# Patient Record
Sex: Female | Born: 1985 | Hispanic: Yes | Marital: Single | Smoking: Never smoker
Health system: Southern US, Community
[De-identification: ages and names within clinical notes are randomized; demographics above are authoritative.]

## PROBLEM LIST (undated history)

## (undated) DIAGNOSIS — Z9889 Other specified postprocedural states: Secondary | ICD-10-CM

## (undated) DIAGNOSIS — N6312 Unspecified lump in the right breast, upper inner quadrant: Secondary | ICD-10-CM

## (undated) DIAGNOSIS — Z789 Other specified health status: Secondary | ICD-10-CM

## (undated) DIAGNOSIS — R519 Headache, unspecified: Secondary | ICD-10-CM

## (undated) DIAGNOSIS — C801 Malignant (primary) neoplasm, unspecified: Secondary | ICD-10-CM

## (undated) HISTORY — PX: NO PAST SURGERIES: SHX2092

## (undated) HISTORY — DX: Unspecified lump in the right breast, upper inner quadrant: N63.12

---

## 2009-02-28 ENCOUNTER — Ambulatory Visit (HOSPITAL_COMMUNITY): Admission: RE | Admit: 2009-02-28 | Discharge: 2009-02-28 | Payer: Self-pay | Admitting: Obstetrics

## 2009-07-02 ENCOUNTER — Inpatient Hospital Stay (HOSPITAL_COMMUNITY): Admission: AD | Admit: 2009-07-02 | Discharge: 2009-07-04 | Payer: Self-pay | Admitting: Obstetrics

## 2010-03-20 ENCOUNTER — Encounter: Admission: RE | Admit: 2010-03-20 | Discharge: 2010-03-20 | Payer: Self-pay | Admitting: Family Medicine

## 2010-05-19 ENCOUNTER — Encounter: Payer: Self-pay | Admitting: Obstetrics

## 2010-06-07 ENCOUNTER — Other Ambulatory Visit: Payer: Self-pay | Admitting: Family Medicine

## 2010-06-13 IMAGING — US US OB DETAIL+14 WK
1 series · 18 of 28 positions shown · non-contrast
Comparison: none

OBSTETRICAL ULTRASOUND:
 This ultrasound was performed in The [HOSPITAL], and the [facility] report will be stored to [REDACTED] PACS.  This report is also available in [HOSPITAL]?s accessANYware.

[Series 1: us ob detail+14 wk · 18 of 89 slices shown]
[im 1/89]
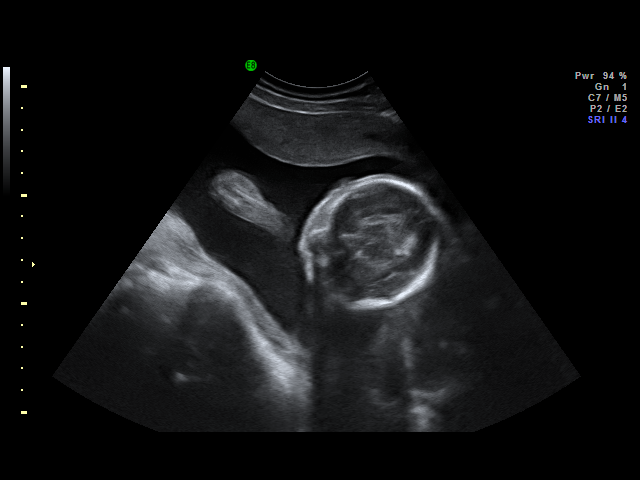
[im 7/89]
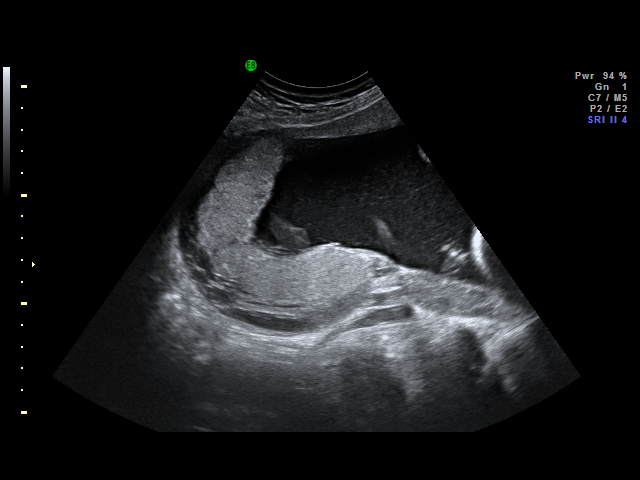
[im 10/89]
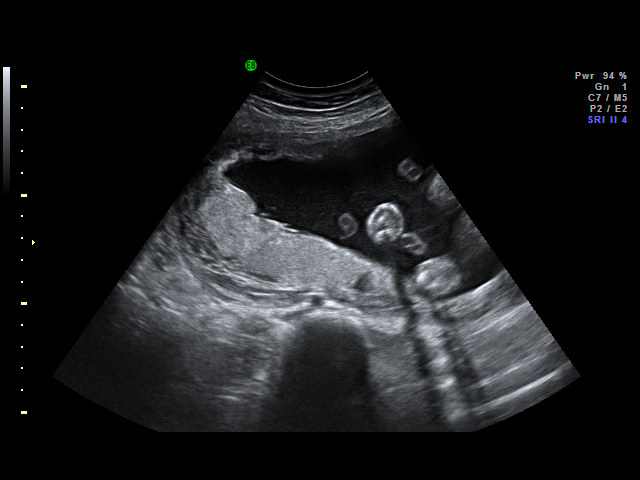
[im 17/89]
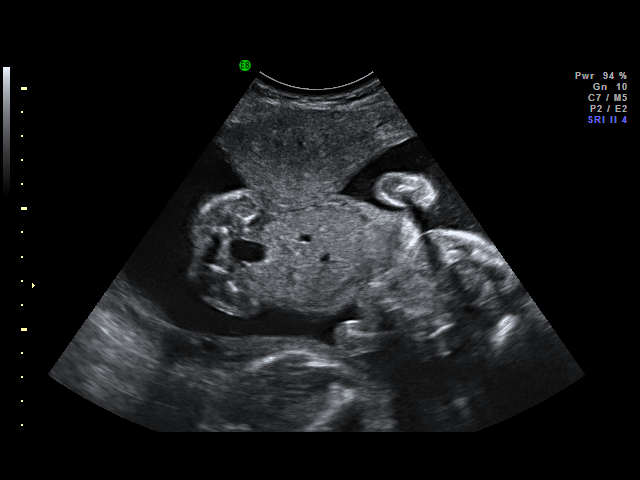
[im 23/89]
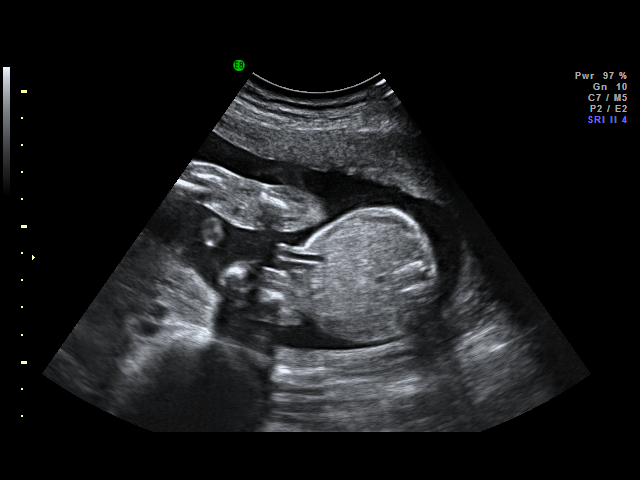
[im 27/89]
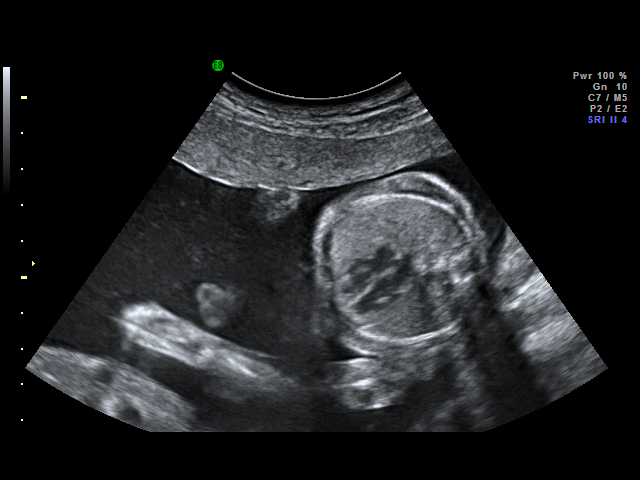
[im 33/89]
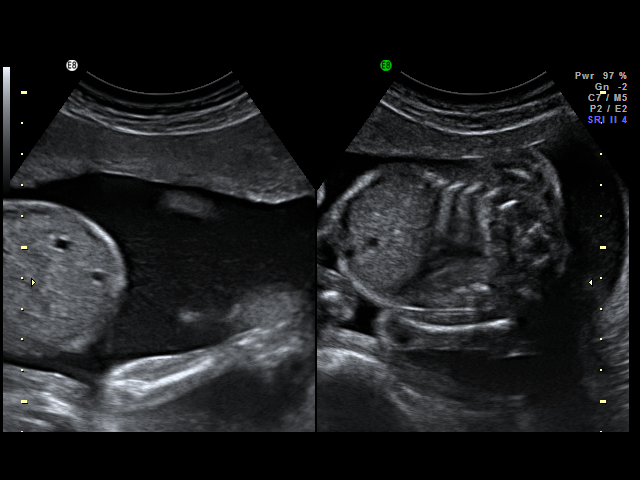
[im 36/89]
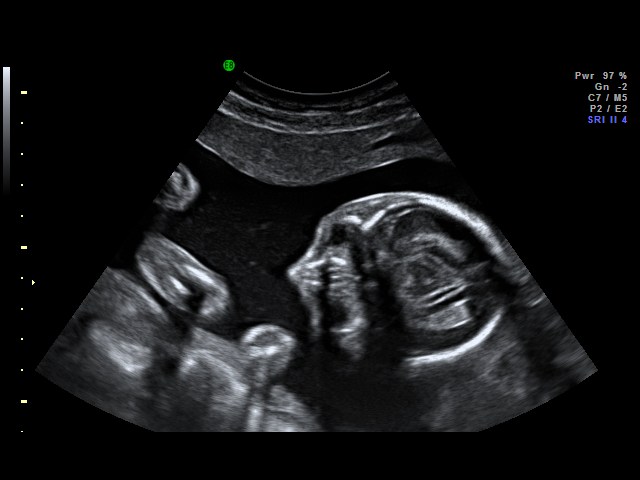
[im 43/89]
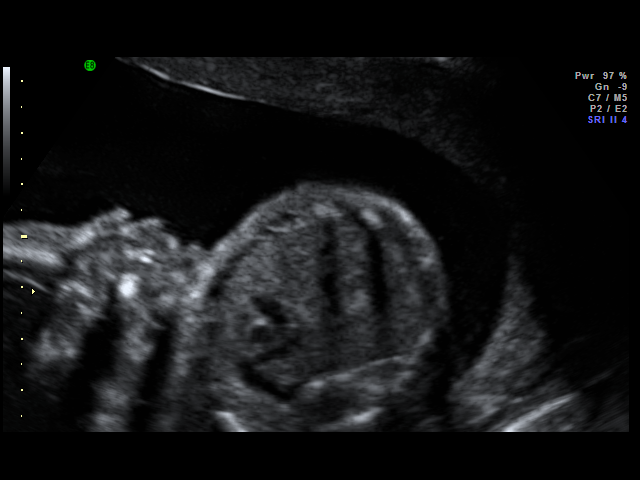
[im 46/89]
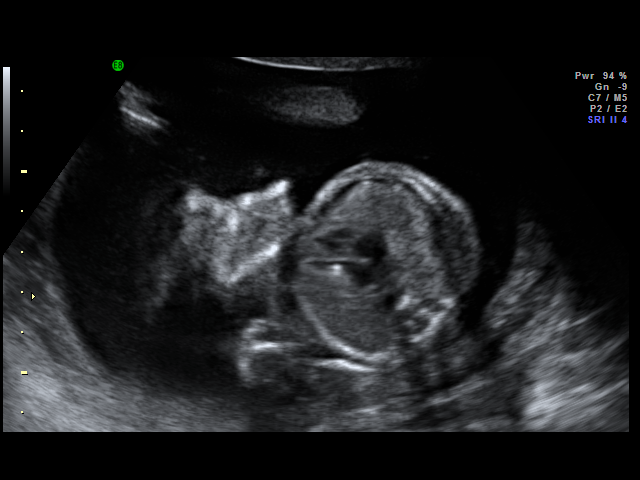
[im 53/89]
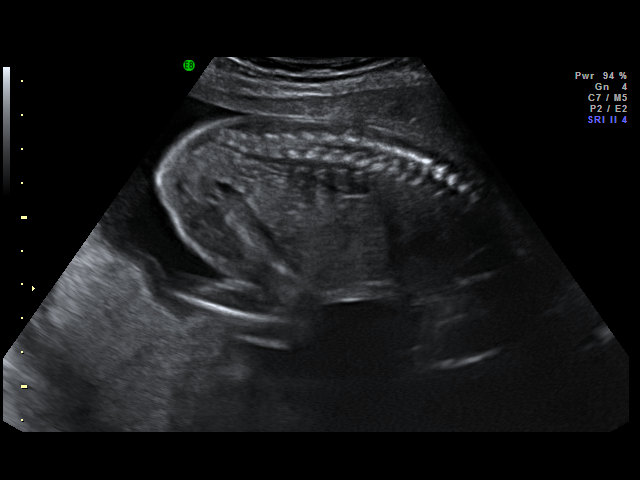
[im 56/89]
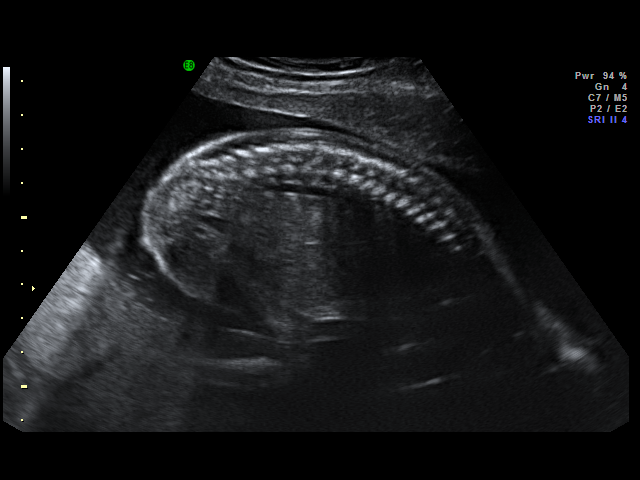
[im 62/89]
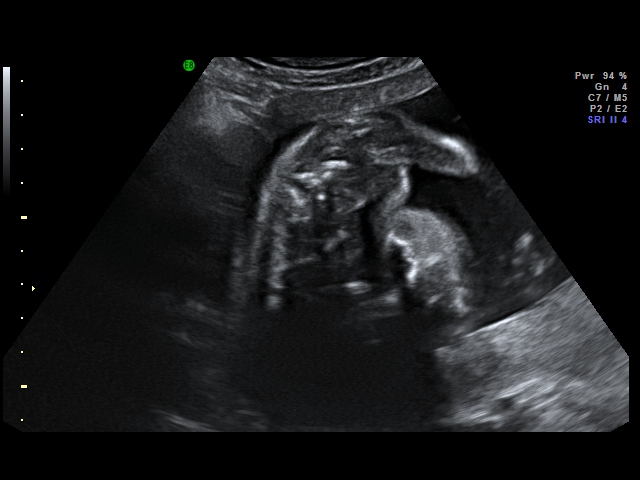
[im 69/89]
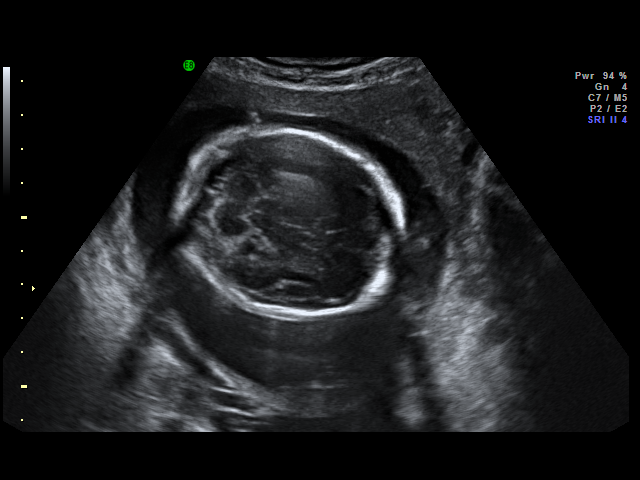
[im 72/89]
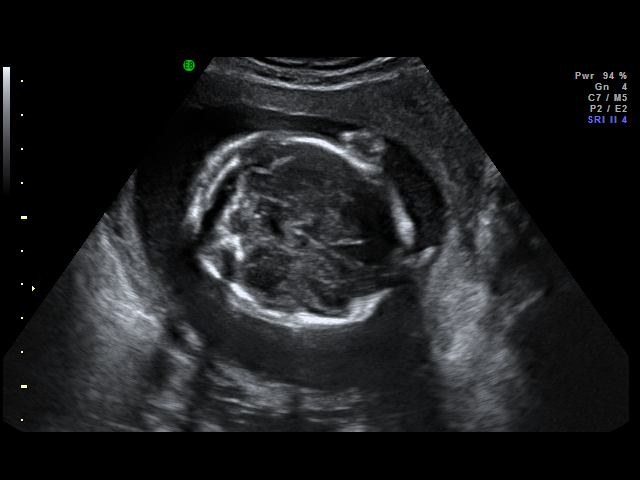
[im 79/89]
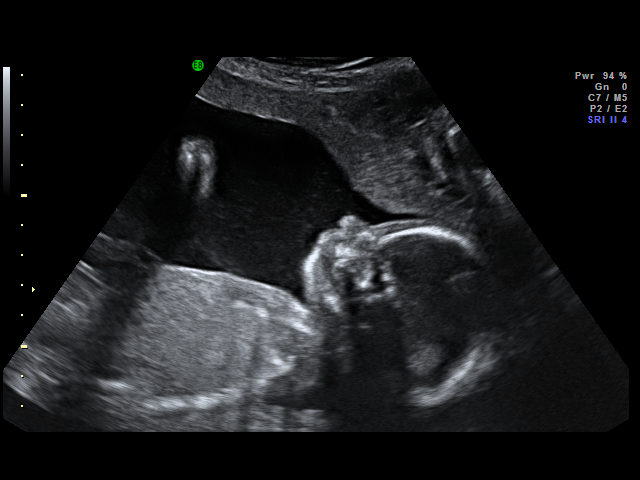
[im 82/89]
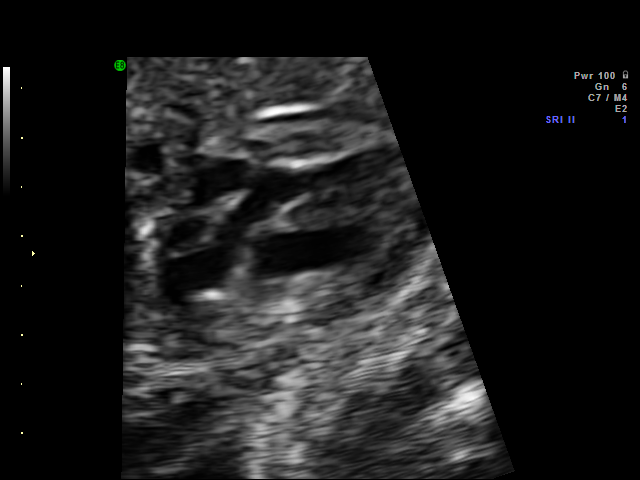
[im 89/89]
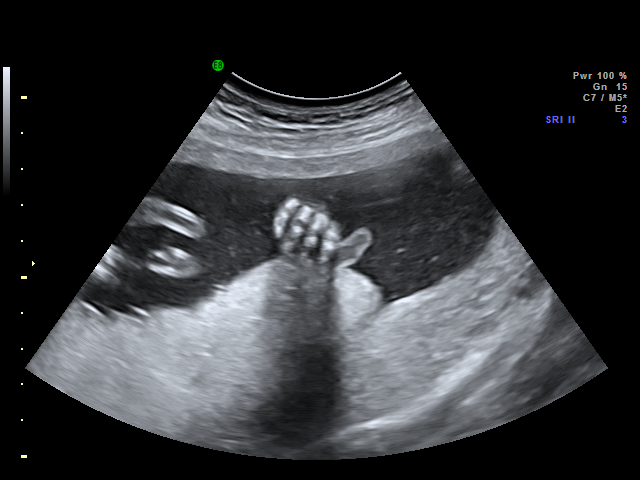

[18 of 28 positions shown; findings below may reference images not displayed]

IMPRESSION: [facility] has also been faxed to the ordering physician.

## 2010-07-22 LAB — CBC
Platelets: 172 10*3/uL (ref 150–400)
RBC: 3.24 MIL/uL — ABNORMAL LOW (ref 3.87–5.11)
RDW: 13.9 % (ref 11.5–15.5)
WBC: 16.8 10*3/uL — ABNORMAL HIGH (ref 4.0–10.5)

## 2010-07-22 LAB — RPR: RPR Ser Ql: NONREACTIVE

## 2011-07-09 ENCOUNTER — Encounter (HOSPITAL_COMMUNITY): Payer: Self-pay | Admitting: *Deleted

## 2011-07-09 ENCOUNTER — Emergency Department (HOSPITAL_COMMUNITY)
Admission: EM | Admit: 2011-07-09 | Discharge: 2011-07-09 | Disposition: A | Discharge status: Home / Self Care | Payer: Self-pay | Source: Home / Self Care | Attending: Emergency Medicine | Admitting: Emergency Medicine

## 2011-07-09 LAB — POCT RAPID STREP A: Streptococcus, Group A Screen (Direct): NEGATIVE

## 2011-07-09 MED ORDER — OMEPRAZOLE 20 MG PO CPDR: 20.0000 mg | DELAYED_RELEASE_CAPSULE | Freq: Two times a day (BID) | ORAL | Status: DC

## 2011-07-09 MED ORDER — OMEPRAZOLE 20 MG PO CPDR: 20.0000 mg | DELAYED_RELEASE_CAPSULE | Freq: Every day | ORAL | Status: AC

## 2011-07-09 MED ORDER — FAMOTIDINE 20 MG PO TABS: 20.0000 mg | ORAL_TABLET | Freq: Two times a day (BID) | ORAL | Status: DC

## 2011-07-09 MED ORDER — FAMOTIDINE 20 MG PO TABS: 20.0000 mg | ORAL_TABLET | Freq: Two times a day (BID) | ORAL | Status: AC

## 2011-07-09 NOTE — ED Notes (Signed)
Obtained a swap sample from Pt's throat and placed in [facility] lab

## 2011-07-09 NOTE — ED Provider Notes (Signed)
History     CSN: [id number]  Arrival date & time 07/09/11  1511   First MD Initiated Contact with Patient 07/09/11 1523      Chief Complaint  Patient presents with  . Sore Throat    (Consider location/radiation/quality/duration/timing/severity/associated sxs/prior treatment) HPI Comments: Patient reports sore throat, nonproductive cough, wheezing at night for 2 months. Symptoms worse at night and early in the morning. Reports occasional chest tightness when she lies down. No chest pain, shortness breath. No nasal congestion, postnasal drip. No abdominal pain, nausea, vomiting. Patient was seen by her doctor at [facility] for this, and was thought to have a viral syndrome. Patient has been taking over-the-counter cold medications without relief. No previous symptoms like this.  ROS as noted in HPI. All other ROS negative.   Patient is a 26 y.o. female presenting with pharyngitis. The history is provided by the patient. The history is limited by a language barrier. A language interpreter was used.  Sore Throat This is a chronic problem. The current episode started more than 1 week ago. The problem occurs constantly. The problem has not changed since onset.Pertinent negatives include no abdominal pain. Exacerbated by: Lying down. The symptoms are relieved by nothing. Treatments tried: OTC cold medications. The treatment provided no relief.    History reviewed. No pertinent past medical history.  History reviewed. No pertinent past surgical history.  History reviewed. No pertinent family history.  History  Substance Use Topics  . Smoking status: Never Smoker   . Smokeless tobacco: Not on file  . Alcohol Use: No    OB History    Grav Para Term Preterm Abortions TAB SAB Ect Mult Living                  Review of Systems  Gastrointestinal: Negative for abdominal pain.    Allergies  Review of patient's allergies indicates no known allergies.  Home Medications   Current  Outpatient Rx  Name Route Sig Dispense Refill  . FAMOTIDINE 20 MG PO TABS Oral Take 1 tablet (20 mg total) by mouth 2 (two) times daily. 60 tablet 0  . OMEPRAZOLE 20 MG PO CPDR Oral Take 1 capsule (20 mg total) by mouth daily. 30 capsule 0    Take before a meal    BP 103/62  Pulse 80  Temp(Src) 98.2 F (36.8 C) (Oral)  Resp 16  SpO2 100%  LMP 06/22/2011  Physical Exam  Nursing note and vitals reviewed. Constitutional: She is oriented to person, place, and time. She appears well-developed and well-nourished. No distress.  HENT:  Head: Normocephalic and atraumatic.  Right Ear: Tympanic membrane normal.  Left Ear: Tympanic membrane normal.  Nose: Nose normal.  Mouth/Throat: Uvula is midline, oropharynx is clear and moist and mucous membranes are normal.  Eyes: Conjunctivae and EOM are normal.  Neck: Normal range of motion. Neck supple.  Cardiovascular: Normal rate.   Pulmonary/Chest: Effort normal and breath sounds normal.  Abdominal: She exhibits no distension.  Musculoskeletal: Normal range of motion.  Neurological: She is alert and oriented to person, place, and time.  Skin: Skin is warm and dry.  Psychiatric: She has a normal mood and affect. Her behavior is normal. Judgment and thought content normal.    ED Course  Procedures (including critical care time)   Labs Reviewed  POCT RAPID STREP A ([facility] CARE ONLY)   No results found.   1. Reflux esophagitis       MDM  H&P most consistent  with reflux esophagitis. Will start her on PPI and H2 blocker, and have her followup with her Dr. at [facility] as needed.  Domenick Gong, MD 07/09/11 [phone]

## 2011-07-09 NOTE — ED Notes (Signed)
pT  REPORTS  SYMPTOMS  OF  SORETHROAT     X 2  MONTHS   SHE  WAS  SEEN    HEALTH SERVE  2  WEEKS  AGO  FOR URI      -  AT  THIS  TIME  SHE  IS  SITTING  UPRIGHT ON EXAM TABLE  SPEAKING IN  COMPLETE  SENTANCES      NO  ACUTE  DISTRESS

## 2012-05-13 ENCOUNTER — Encounter (HOSPITAL_COMMUNITY): Payer: Self-pay | Admitting: *Deleted

## 2012-05-13 ENCOUNTER — Other Ambulatory Visit: Payer: Self-pay | Admitting: Family Medicine

## 2012-05-13 DIAGNOSIS — N63 Unspecified lump in unspecified breast: Secondary | ICD-10-CM

## 2012-05-18 ENCOUNTER — Encounter (HOSPITAL_COMMUNITY): Payer: Self-pay

## 2012-05-18 ENCOUNTER — Ambulatory Visit (HOSPITAL_COMMUNITY)
Admission: RE | Admit: 2012-05-18 | Discharge: 2012-05-18 | Disposition: A | Discharge status: Home / Self Care | Payer: Self-pay | Source: Ambulatory Visit | Attending: Obstetrics and Gynecology | Admitting: Obstetrics and Gynecology

## 2012-05-18 ENCOUNTER — Other Ambulatory Visit: Payer: Self-pay | Admitting: Obstetrics and Gynecology

## 2012-05-18 VITALS — BP 102/60 | Temp 97.5°F | Ht 62.5 in | Wt 124.0 lb

## 2012-05-18 DIAGNOSIS — N6312 Unspecified lump in the right breast, upper inner quadrant: Secondary | ICD-10-CM

## 2012-05-18 DIAGNOSIS — Z1239 Encounter for other screening for malignant neoplasm of breast: Secondary | ICD-10-CM

## 2012-05-18 DIAGNOSIS — N63 Unspecified lump in unspecified breast: Secondary | ICD-10-CM

## 2012-05-18 HISTORY — DX: Unspecified lump in the right breast, upper inner quadrant: N63.12

## 2012-05-18 NOTE — Patient Instructions (Signed)
Taught patient how to perform BSE. Patient did not need a Pap smear today due to last Pap smear was March 2013 per patient. Let her know BCCCP will cover Pap smears every 3 years unless has a history of abnormal Pap smears. Referred patient to the [facility] for right breast ultrasound. Appointment scheduled for Friday, May 21, 2012 at 0930. Patient aware of appointment and will be there. Patient verbalized understanding.

## 2012-05-18 NOTE — Progress Notes (Signed)
Patient complained of right breast lump x 2 years. Patient stated has been having right breast pain at lump x 3 months. Patients states pain comes and goes. Patient rated pain at a 6 out of 10.  Pap Smear:    Pap smear not completed today. Last Pap smear was March 2013 and normal per patient. Per patient no history of abnormal Pap smears. Pap smear result from 06/07/2010 in EPIC.  Physical exam: Breasts Breasts symmetrical. No skin abnormalities bilateral breasts. No nipple retraction bilateral breasts. No nipple discharge bilateral breasts. No lymphadenopathy. No lumps palpated left breast. Palpated lump in right breast at 1 o'clock 3 cm from the areola. Patient complained of pain and discomfort when palpated lump within right breast. Referred patient to the [facility] for right breast ultrasound. Appointment scheduled for Friday, May 21, 2012 at 0930.       Pelvic/Bimanual No Pap smear completed today since last Pap smear was March 2013 per patient. Pap smear not indicated per BCCCP guidelines.

## 2012-05-21 ENCOUNTER — Ambulatory Visit
Admission: RE | Admit: 2012-05-21 | Discharge: 2012-05-21 | Disposition: A | Discharge status: Home / Self Care | Payer: No Typology Code available for payment source | Source: Ambulatory Visit | Attending: Obstetrics and Gynecology | Admitting: Obstetrics and Gynecology

## 2012-05-21 DIAGNOSIS — N63 Unspecified lump in unspecified breast: Secondary | ICD-10-CM

## 2012-05-28 ENCOUNTER — Ambulatory Visit (HOSPITAL_COMMUNITY): Payer: Self-pay

## 2013-09-30 ENCOUNTER — Other Ambulatory Visit: Payer: Self-pay | Admitting: Nurse Practitioner

## 2013-09-30 DIAGNOSIS — N644 Mastodynia: Secondary | ICD-10-CM

## 2013-09-30 DIAGNOSIS — N63 Unspecified lump in unspecified breast: Secondary | ICD-10-CM

## 2013-10-05 ENCOUNTER — Ambulatory Visit
Admission: RE | Admit: 2013-10-05 | Discharge: 2013-10-05 | Disposition: A | Discharge status: Home / Self Care | Payer: No Typology Code available for payment source | Source: Ambulatory Visit | Attending: Nurse Practitioner | Admitting: Nurse Practitioner

## 2013-10-05 DIAGNOSIS — N644 Mastodynia: Secondary | ICD-10-CM

## 2013-10-05 DIAGNOSIS — N63 Unspecified lump in unspecified breast: Secondary | ICD-10-CM

## 2014-02-27 ENCOUNTER — Encounter (HOSPITAL_COMMUNITY): Payer: Self-pay

## 2014-03-16 LAB — PROCEDURE REPORT - SCANNED: PAP SMEAR: NEGATIVE

## 2014-03-31 LAB — OB RESULTS CONSOLE RPR: RPR: NONREACTIVE

## 2014-03-31 LAB — OB RESULTS CONSOLE RUBELLA ANTIBODY, IGM: RUBELLA: IMMUNE

## 2014-03-31 LAB — OB RESULTS CONSOLE ANTIBODY SCREEN: Antibody Screen: NEGATIVE

## 2014-03-31 LAB — OB RESULTS CONSOLE GC/CHLAMYDIA
Chlamydia: NEGATIVE
Gonorrhea: NEGATIVE

## 2014-03-31 LAB — OB RESULTS CONSOLE ABO/RH: RH Type: POSITIVE

## 2014-03-31 LAB — OB RESULTS CONSOLE HEPATITIS B SURFACE ANTIGEN: HEP B S AG: NEGATIVE

## 2014-03-31 LAB — OB RESULTS CONSOLE HIV ANTIBODY (ROUTINE TESTING): HIV: NONREACTIVE

## 2014-04-07 ENCOUNTER — Inpatient Hospital Stay (HOSPITAL_COMMUNITY)
Admission: AD | Admit: 2014-04-07 | Payer: No Typology Code available for payment source | Source: Ambulatory Visit | Admitting: Obstetrics

## 2014-04-28 NOTE — L&D Delivery Note (Signed)
Delivery Note At 12:19 PM a viable female was delivered via Vaginal, Spontaneous Delivery (Presentation: ; Occiput Anterior).  APGAR: 9, 9; weight  .   Placenta status: Intact, Manual removal.  Cord: 3 vessels with the following complications: None.  Cord pH: not done  Anesthesia: Local  Episiotomy: None Lacerations: 2nd degree;Perineal Suture Repair: 2.0 Est. Blood Loss (mL):    Mom to postpartum.  Baby to Couplet care / Skin to Skin.  MARSHALL,BERNARD A 10/04/2014, 12:33 PM

## 2014-08-11 ENCOUNTER — Emergency Department (HOSPITAL_COMMUNITY)
Admission: EM | Admit: 2014-08-11 | Discharge: 2014-08-11 | Disposition: A | Discharge status: Home / Self Care | Payer: No Typology Code available for payment source | Source: Home / Self Care | Attending: Family Medicine | Admitting: Family Medicine

## 2014-08-11 ENCOUNTER — Encounter (HOSPITAL_COMMUNITY): Payer: Self-pay | Admitting: Emergency Medicine

## 2014-08-11 DIAGNOSIS — K644 Residual hemorrhoidal skin tags: Secondary | ICD-10-CM

## 2014-08-11 DIAGNOSIS — K648 Other hemorrhoids: Secondary | ICD-10-CM

## 2014-08-11 DIAGNOSIS — Z3493 Encounter for supervision of normal pregnancy, unspecified, third trimester: Secondary | ICD-10-CM

## 2014-08-11 DIAGNOSIS — Z331 Pregnant state, incidental: Secondary | ICD-10-CM

## 2014-08-11 DIAGNOSIS — K5909 Other constipation: Secondary | ICD-10-CM

## 2014-08-11 MED ORDER — GLYCERIN (LAXATIVE) 2 G RE SUPP: 1.0000 | Freq: Once | RECTAL | Status: DC | PRN

## 2014-08-11 MED ORDER — HYDROCORTISONE 2.5 % RE CREA: 1.0000 "application " | TOPICAL_CREAM | Freq: Two times a day (BID) | RECTAL | Status: DC

## 2014-08-11 NOTE — ED Provider Notes (Signed)
CSN: [id number]     Arrival date & time 08/11/14  1441 History   First MD Initiated Contact with Patient 08/11/14 1613     Chief Complaint  Patient presents with  . Hemorrhoids   (Consider location/radiation/quality/duration/timing/severity/associated sxs/prior Treatment) HPI  Pt encounter aided by spanish interpretor   Anal pain: started 2 days ago. Small lump felt near the rectum. No BM 3 days ago. Difficulty to pass. Pain is getting worse. Has not tried anything for the pain. Mango juice w/o improvement in BMs.  Symptoms constant. Denies fevers, abdominal pain, nausea, vomiting, diarrhea, rash, chest pain, shortness breath, palpitations, headache, LOC.    7 mo pregnant w/ a boy.  Denies vaginal discharge, abdominal pain, right upper quadrant pain, contractions. Excellent fetal movement.  History reviewed. No pertinent past medical history. History reviewed. No pertinent past surgical history. Family History  Problem Relation Age of Onset  . Hypertension Mother    History  Substance Use Topics  . Smoking status: Never Smoker   . Smokeless tobacco: Not on file  . Alcohol Use: No   OB History    Gravida Para Term Preterm AB TAB SAB Ectopic Multiple Living   Review of Systems Per HPI with all other pertinent systems negative.   Allergies  Review of patient's allergies indicates no known allergies.  Home Medications   Prior to Admission medications   Medication Sig Start Date End Date Taking? Authorizing Provider  Prenatal Vit-Fe Fumarate-FA (PRENATAL MULTIVITAMIN) TABS tablet Take 1 tablet by mouth daily at 12 noon.   Yes Historical Provider, MD  glycerin adult (GLYCERIN ADULT) 2 G SUPP Place 1 suppository rectally once as needed for moderate constipation. 08/11/14   Ozella Rocks, MD  hydrocortisone (ANUSOL-HC) 2.5 % rectal cream Place 1 application rectally 2 (two) times daily. 08/11/14   Ozella Rocks, MD   BP 97/64 mmHg  Pulse 79  Temp(Src)  98.7 F (37.1 C) (Oral)  Resp 16  SpO2 97% Physical Exam Physical Exam  Constitutional: oriented to person, place, and time. appears well-developed and well-nourished. No distress.  HENT:  Head: Normocephalic and atraumatic.  Eyes: EOMI. PERRL.   Neck: Normal range of motion.  Cardiovascular: RRR, no m/r/g, 2+ distal pulses,  Pulmonary/Chest: Effort normal and breath sounds normal. No respiratory distress.  Abdominal: Soft. Bowel sounds are normal. NonTTP, gravid abdomen   Fetal heart tones 150/m.   Musculoskeletal: Normal range of motion. Non ttp, no effusion.  Neurological: alert and oriented to person, place, and time.  Skin: Skin is warm. No rash noted. non diaphoretic.  Psychiatric: normal mood and affect. behavior is normal. Judgment and thought content normal.   Anus: Single pea-sized external hemorrhoid, no evidence of rectal fissuring or tears.    ED Course  Procedures (including critical care time) Labs Review Labs Reviewed - No data to display  Imaging Review No results found.   MDM   1. External hemorrhoids   2. Third trimester pregnancy   3. Other constipation    Well-appearing surgery Mr. pregnancy. Fetal heart tones reassuring. No premature signs of labor. Patient's external hemorrhoids likely a combination of being constipated as well as vasodilatation secondary to pregnancy. Patient to use Anusol cream and intermittent glycerin suppository. Reiterated the importance of not overusing suppositories. Patient to continue prenatal vitamin. Follow-up with OB/GYN or an MA you if symptoms worsen. Excision of hemorrhoids is not a viable option at  this point in time.   Ozella Rocksavid J Jessicca Stitzer, MD 08/11/14 [phone]

## 2014-08-11 NOTE — ED Notes (Signed)
C/o external hemorrhoids onset yest Also reports she has been constipated x3 days She is 7 months preg Denies blood in the stool, fevers, chills, abd pain Alert, no signs of acute distress.

## 2014-08-11 NOTE — Discharge Instructions (Signed)
You have external hemorrhoids. This is primarily due to your being pregnant and constipation.  Please use the anusol cream for the hemorrhoids. Please use the suppository sparingly for severe constipation.  Please drink plenty of fluids, increase the fiber in your diet and consider eating prunes in order to help you have daily bowel movements. The baby's heart rate was excellent today in our clinic. If your symptoms get worse please [facility] to the [facility].  Usted tiene hemorroides externas. Esto se debe principalmente a que est embarazada y el estreimiento. Por favor, use la crema Anusol para las hemorroides. Utilice el supositorio con moderacin para Education administratorel estreimiento severo. Por favor, beber lquidos en abundancia, aumentar la fibra en su dieta y considere comer ciruelas con el fin de ayudarle a tener movimientos intestinales diarios. La frecuencia cardaca del beb era excelente hoy en da en nuestra clnica. Si sus sntomas empeoran, por favor vaya a [facility]

## 2014-09-03 LAB — OB RESULTS CONSOLE GBS: STREP GROUP B AG: NEGATIVE

## 2014-10-04 ENCOUNTER — Encounter (HOSPITAL_COMMUNITY): Payer: Self-pay | Admitting: *Deleted

## 2014-10-04 ENCOUNTER — Inpatient Hospital Stay (HOSPITAL_COMMUNITY)
Admission: AD | Admit: 2014-10-04 | Discharge: 2014-10-05 | DRG: 767 | Disposition: A | Discharge status: Home / Self Care | Payer: Medicaid Other | Source: Ambulatory Visit | Attending: Obstetrics | Admitting: Obstetrics

## 2014-10-04 DIAGNOSIS — Z3A40 40 weeks gestation of pregnancy: Secondary | ICD-10-CM | POA: Diagnosis present

## 2014-10-04 DIAGNOSIS — IMO0001 Reserved for inherently not codable concepts without codable children: Secondary | ICD-10-CM

## 2014-10-04 HISTORY — DX: Other specified health status: Z78.9

## 2014-10-04 LAB — CBC
HEMATOCRIT: 36.5 % (ref 36.0–46.0)
Hemoglobin: 12.6 g/dL (ref 12.0–15.0)
MCH: 30.6 pg (ref 26.0–34.0)
MCHC: 34.5 g/dL (ref 30.0–36.0)
MCV: 88.6 fL (ref 78.0–100.0)
Platelets: 172 10*3/uL (ref 150–400)
RBC: 4.12 MIL/uL (ref 3.87–5.11)
RDW: 14.5 % (ref 11.5–15.5)
WBC: 12.7 10*3/uL — ABNORMAL HIGH (ref 4.0–10.5)

## 2014-10-04 LAB — URINALYSIS, ROUTINE W REFLEX MICROSCOPIC
Bilirubin Urine: NEGATIVE
GLUCOSE, UA: NEGATIVE mg/dL
KETONES UR: NEGATIVE mg/dL
NITRITE: NEGATIVE
PH: 6 (ref 5.0–8.0)
Protein, ur: NEGATIVE mg/dL
Specific Gravity, Urine: 1.02 (ref 1.005–1.030)
UROBILINOGEN UA: 0.2 mg/dL (ref 0.0–1.0)

## 2014-10-04 LAB — TYPE AND SCREEN
ABO/RH(D): A POS
Antibody Screen: NEGATIVE

## 2014-10-04 LAB — URINE MICROSCOPIC-ADD ON

## 2014-10-04 LAB — ABO/RH: ABO/RH(D): A POS

## 2014-10-04 MED ORDER — WITCH HAZEL-GLYCERIN EX PADS: 1.0000 "application " | MEDICATED_PAD | CUTANEOUS | Status: DC | PRN

## 2014-10-04 MED ORDER — OXYCODONE-ACETAMINOPHEN 5-325 MG PO TABS: 2.0000 | ORAL_TABLET | ORAL | Status: DC | PRN

## 2014-10-04 MED ORDER — BUTORPHANOL TARTRATE 1 MG/ML IJ SOLN: 1.0000 mg | INTRAMUSCULAR | Status: DC | PRN

## 2014-10-04 MED ORDER — ACETAMINOPHEN 325 MG PO TABS: 650.0000 mg | ORAL_TABLET | ORAL | Status: DC | PRN

## 2014-10-04 MED ORDER — CITRIC ACID-SODIUM CITRATE 334-500 MG/5ML PO SOLN: 30.0000 mL | ORAL | Status: DC | PRN

## 2014-10-04 MED ORDER — OXYTOCIN 40 UNITS IN LACTATED RINGERS INFUSION - SIMPLE MED: 62.5000 mL/h | INTRAVENOUS | Status: DC

## 2014-10-04 MED ORDER — TERBUTALINE SULFATE 1 MG/ML IJ SOLN
0.2500 mg | Freq: Once | INTRAMUSCULAR | Status: DC | PRN
Start: 2014-10-04 — End: 2014-10-04
  Filled 2014-10-04: qty 1

## 2014-10-04 MED ORDER — LACTATED RINGERS IV SOLN
INTRAVENOUS | Status: DC
  Administered 2014-10-04: 10:00:00 via INTRAVENOUS

## 2014-10-04 MED ORDER — BENZOCAINE-MENTHOL 20-0.5 % EX AERO
1.0000 "application " | INHALATION_SPRAY | CUTANEOUS | Status: DC | PRN
  Administered 2014-10-04: 1 via TOPICAL
  Filled 2014-10-04: qty 56

## 2014-10-04 MED ORDER — PRENATAL MULTIVITAMIN CH
1.0000 | ORAL_TABLET | Freq: Every day | ORAL | Status: DC
  Administered 2014-10-05: 1 via ORAL
  Filled 2014-10-04: qty 1

## 2014-10-04 MED ORDER — SIMETHICONE 80 MG PO CHEW
80.0000 mg | CHEWABLE_TABLET | ORAL | Status: DC | PRN
Start: 2014-10-04 — End: 2014-10-05

## 2014-10-04 MED ORDER — ONDANSETRON HCL 4 MG/2ML IJ SOLN: 4.0000 mg | Freq: Four times a day (QID) | INTRAMUSCULAR | Status: DC | PRN

## 2014-10-04 MED ORDER — ONDANSETRON HCL 4 MG/2ML IJ SOLN: 4.0000 mg | INTRAMUSCULAR | Status: DC | PRN

## 2014-10-04 MED ORDER — OXYTOCIN 40 UNITS IN LACTATED RINGERS INFUSION - SIMPLE MED
1.0000 m[IU]/min | INTRAVENOUS | Status: DC
  Administered 2014-10-04: 2 m[IU]/min via INTRAVENOUS
  Filled 2014-10-04: qty 1000

## 2014-10-04 MED ORDER — OXYCODONE-ACETAMINOPHEN 5-325 MG PO TABS
1.0000 | ORAL_TABLET | ORAL | Status: DC | PRN
  Filled 2014-10-04: qty 1

## 2014-10-04 MED ORDER — IBUPROFEN 600 MG PO TABS
600.0000 mg | ORAL_TABLET | Freq: Four times a day (QID) | ORAL | Status: DC
  Administered 2014-10-04 – 2014-10-05 (×4): 600 mg via ORAL
  Filled 2014-10-04 (×4): qty 1

## 2014-10-04 MED ORDER — METHYLERGONOVINE MALEATE 0.2 MG/ML IJ SOLN
0.2000 mg | INTRAMUSCULAR | Status: DC | PRN
  Administered 2014-10-04: 0.2 mg via INTRAMUSCULAR

## 2014-10-04 MED ORDER — OXYTOCIN BOLUS FROM INFUSION
500.0000 mL | INTRAVENOUS | Status: DC
  Administered 2014-10-04: 500 mL via INTRAVENOUS

## 2014-10-04 MED ORDER — OXYCODONE-ACETAMINOPHEN 5-325 MG PO TABS
1.0000 | ORAL_TABLET | ORAL | Status: DC | PRN
  Administered 2014-10-04 (×2): 1 via ORAL
  Filled 2014-10-04: qty 1

## 2014-10-04 MED ORDER — FLEET ENEMA 7-19 GM/118ML RE ENEM: 1.0000 | ENEMA | RECTAL | Status: DC | PRN

## 2014-10-04 MED ORDER — TETANUS-DIPHTH-ACELL PERTUSSIS 5-2.5-18.5 LF-MCG/0.5 IM SUSP
0.5000 mL | Freq: Once | INTRAMUSCULAR | Status: AC
  Administered 2014-10-05: 0.5 mL via INTRAMUSCULAR

## 2014-10-04 MED ORDER — LANOLIN HYDROUS EX OINT: TOPICAL_OINTMENT | CUTANEOUS | Status: DC | PRN

## 2014-10-04 MED ORDER — METHYLERGONOVINE MALEATE 0.2 MG PO TABS: 0.2000 mg | ORAL_TABLET | ORAL | Status: DC | PRN

## 2014-10-04 MED ORDER — METHYLERGONOVINE MALEATE 0.2 MG/ML IJ SOLN
INTRAMUSCULAR | Status: AC
  Administered 2014-10-04: 0.2 mg via INTRAMUSCULAR
  Filled 2014-10-04: qty 1

## 2014-10-04 MED ORDER — DIBUCAINE 1 % RE OINT: 1.0000 "application " | TOPICAL_OINTMENT | RECTAL | Status: DC | PRN

## 2014-10-04 MED ORDER — FERROUS SULFATE 325 (65 FE) MG PO TABS
325.0000 mg | ORAL_TABLET | Freq: Two times a day (BID) | ORAL | Status: DC
Start: 2014-10-04 — End: 2014-10-05
  Administered 2014-10-04 – 2014-10-05 (×2): 325 mg via ORAL
  Filled 2014-10-04 (×2): qty 1

## 2014-10-04 MED ORDER — ONDANSETRON HCL 4 MG PO TABS
4.0000 mg | ORAL_TABLET | ORAL | Status: DC | PRN
Start: 2014-10-04 — End: 2014-10-05

## 2014-10-04 MED ORDER — LACTATED RINGERS IV SOLN: 500.0000 mL | INTRAVENOUS | Status: DC | PRN

## 2014-10-04 MED ORDER — LIDOCAINE HCL (PF) 1 % IJ SOLN
30.0000 mL | INTRAMUSCULAR | Status: DC | PRN
  Administered 2014-10-04: 30 mL via SUBCUTANEOUS
  Filled 2014-10-04: qty 30

## 2014-10-04 MED ORDER — SENNOSIDES-DOCUSATE SODIUM 8.6-50 MG PO TABS
2.0000 | ORAL_TABLET | ORAL | Status: DC
  Administered 2014-10-04: 2 via ORAL
  Filled 2014-10-04: qty 2

## 2014-10-04 MED ORDER — ZOLPIDEM TARTRATE 5 MG PO TABS: 5.0000 mg | ORAL_TABLET | Freq: Every evening | ORAL | Status: DC | PRN

## 2014-10-04 MED ORDER — DIPHENHYDRAMINE HCL 25 MG PO CAPS: 25.0000 mg | ORAL_CAPSULE | Freq: Four times a day (QID) | ORAL | Status: DC | PRN

## 2014-10-04 NOTE — MAU Note (Signed)
Pt states she has been having U/C's since 1100 yesterday.  They are now every 5 minutes and becoming more intense.  Pt states she is having some bloody show.  Denies ROM.  Good fetal movement.

## 2014-10-04 NOTE — H&P (Signed)
This is Dr. Francoise CeoBernard Marshall dictating the history and physical on  Jodi Murray  she's a 29 year old gravida 2 para 1001 at 40 weeks and 2 days EDC 10/02/2014 negative GBS admitted in labor 4 cm 90% vertex at 0 station amniotomy was performed fluid looks initially clear IUPC was inserted and she is contracting every 3-4 minutes Her past medical history was negative Social history denies smoking drinking and using drugs Family history negative for high blood pressure diabetes System review she denies headache nausea vomiting abdominal pain clinic and a visual disturbance Physical exam well-developed female in labor HEENT ENT negative 's negative Heart regular rhythm no murmurs no gallops Abdomen term Pelvic as described above Extremities negative

## 2014-10-04 NOTE — Progress Notes (Signed)
A defined hematoma surrounding the left perineal laceration repair is approximately 5 cms in length x 2 cms. In width. Ice pack renewed.

## 2014-10-04 NOTE — Progress Notes (Signed)
CSW acknowledges consult for difficulties paying for medications.  CSW recommends contacting care management if this becomes a presenting problem during the postpartum period.  Consult CSW if social work needs arise.

## 2014-10-04 NOTE — Progress Notes (Signed)
interpreter used to help check pain and to explain plan of care for night.

## 2014-10-05 LAB — CBC
HCT: 32.9 % — ABNORMAL LOW (ref 36.0–46.0)
Hemoglobin: 11.1 g/dL — ABNORMAL LOW (ref 12.0–15.0)
MCH: 30.1 pg (ref 26.0–34.0)
MCHC: 33.7 g/dL (ref 30.0–36.0)
MCV: 89.2 fL (ref 78.0–100.0)
Platelets: 150 10*3/uL (ref 150–400)
RBC: 3.69 MIL/uL — ABNORMAL LOW (ref 3.87–5.11)
RDW: 14.5 % (ref 11.5–15.5)
WBC: 14.9 10*3/uL — ABNORMAL HIGH (ref 4.0–10.5)

## 2014-10-05 LAB — RPR: RPR Ser Ql: NONREACTIVE

## 2014-10-05 NOTE — Progress Notes (Signed)
UR chart review completed.  

## 2014-10-05 NOTE — Progress Notes (Signed)
Patient ID: Jodi Murray, female   DOB: 1985-09-22, 29 y.o.   MRN: [id number] Postpartum day one Blood pressure 99/67 respiration 18 pulse 66 Patient feels fine Fundus firm Lochia moderate Legs negative Doing well

## 2014-10-05 NOTE — Discharge Summary (Signed)
Obstetric Discharge Summary Reason for Admission: induction of labor Prenatal Procedures: none Intrapartum Procedures: spontaneous vaginal delivery Postpartum Procedures: none Complications-Operative and Postpartum: none HEMOGLOBIN  Date Value Ref Range Status  10/05/2014 11.1* 12.0 - 15.0 g/dL Final   HCT  Date Value Ref Range Status  10/05/2014 32.9* 36.0 - 46.0 % Final    Physical Exam:  General: alert Lochia: appropriate Uterine Fundus: firm Incision: healing well DVT Evaluation: No evidence of DVT seen on physical exam.  Discharge Diagnoses: Term Pregnancy-delivered  Discharge Information: Date: 10/05/2014 Activity: pelvic rest Diet: routine Medications: Percocet Condition: improved Instructions: refer to practice specific booklet Discharge to: home Follow-up Information    Follow up with Jodi Cosier, MD.   Specialty:  Obstetrics and Gynecology   Contact information:   [address] [place] [address] [phone]       Newborn Data: Live born female  Birth Weight: 6 lb 7.7 oz (2940 g) APGAR: 9, 9  Home with mother.  Jodi Murray A 10/05/2014, 6:59 AM

## 2014-10-05 NOTE — Lactation Note (Signed)
This note was copied from the chart of Jodi Murray. Lactation Consultation Note  Patient Name: Jodi Murray [id number] Date: 10/05/2014 Reason for consult: Follow-up assessment - Spanish interpreter Desmond Dike present at consult  Pecola Leisure is 25 hours old and has been to the breast at 1157 for 10 mins .  Per mom having some soreness. LC assessed breast tissue with moms permission - Nipples appear healthy  Pink , no breakdown, breast are fuller and warmer. Areolas semi - compressible. LC instructed mom on the use  Hand pump and assessed flange, #24 a good fit , LC suggested prior to every latch until tender ness improves to pre-pump  After breast massage, hand express, to make the nipple and areola more elastic for a deeper latch. LC instructed mom on the use shells, already has comfort gels.  Sore nipple and engorgement prevention and tx reviewed with mom.  Mother informed of post-discharge support and given phone number to the lactation department, including services for phone call assistance; out-patient appointments; and breastfeeding support group. List of other breastfeeding resources in the community given in the handout. Encouraged mother to call for problems or concerns related to breastfeeding.   Maternal Data    Feeding    LATCH Score/Interventions This Latch score was by the [facility] RN  Latch: Repeated attempts needed to sustain latch, nipple held in mouth throughout feeding, stimulation needed to elicit sucking reflex. Intervention(s): Assist with latch;Adjust position  Audible Swallowing: A few with stimulation Intervention(s): Skin to skin;Alternate breast massage  Type of Nipple: Everted at rest and after stimulation  Comfort (Breast/Nipple): Filling, red/small blisters or bruises, mild/mod discomfort  Problem noted: Mild/Moderate discomfort Interventions (Mild/moderate discomfort): Comfort gels  Hold (Positioning): Assistance needed to correctly position  infant at breast and maintain latch.  LATCH Score: 6  Lactation Tools Discussed/Used     Consult Status Consult Status: Complete Date: 10/05/14    Kathrin Greathouse 10/05/2014, 1:43 PM

## 2014-10-05 NOTE — Discharge Instructions (Signed)
Discharge instructions   You can wash your hair  Shower  Eat what you want  Drink what you want  See me in 6 weeks  Your ankles are going to swell more in the next 2 weeks than when pregnant  No sex for 6 weeks   Amberrose Friebel A, MD 10/05/2014

## 2014-10-05 NOTE — Progress Notes (Signed)
Interpreter used for shift assessment and for lactation consult that followed.

## 2014-10-05 NOTE — Lactation Note (Signed)
This note was copied from the chart of Jodi Murray. Lactation Consultation Note Mom has a daughter that she BF for 2 months. Not exclusive, she breast and formula/bottle fed. That is her choice as well this time. Mom has small everted nipples. Nipples sore. Rt. Nipple and breast smaller than Lt. Nipple and breast. C/o soreness to nipples. Comfort gels given. Breast massage demonstrated and hand expression taught w/colostrum, encouraged to pat on sore nipples. Interpreter at bedside. Explained about supply and demand, engorgement, supplementing w/formula cutting down milk supply. Mom asking for bottle and baby just BF 7 min. And sleeping. Encouraged to BF longer and unwrap baby. Encouraged if baby satisfied after BF then don't supplement w/formula. Mom states she wants to BF one feeding then the next time bottle feed. Discussed possiblilty of engorgement or low milk supply. Mom encouraged to feed baby 8-12 times/24 hours and with feeding cues. Mom encouraged to waken baby for feeds. Referred to Baby and Me Book in Breastfeeding section Pg. 22-23 for position options and Proper latch demonstration. Deep latch helps prevents nipple soreness. Educated about newborn behavior. Mom encouraged to do skin-to-skin.WH/LC brochure given w/resources, support groups and LC services. Patient Name: Jodi Kemper Cetina [id number] Date: 10/05/2014 Reason for consult: Initial assessment   Maternal Data Has patient been taught Hand Expression?: Yes Does the patient have breastfeeding experience prior to this delivery?: Yes  Feeding    LATCH Score/Interventions Intervention(s): Breast massage;Breast compression     Type of Nipple: Everted at rest and after stimulation  Comfort (Breast/Nipple): Filling, red/small blisters or bruises, mild/mod discomfort  Problem noted: Mild/Moderate discomfort Interventions (Mild/moderate discomfort): Comfort gels;Hand massage;Hand expression  Intervention(s):  Breastfeeding basics reviewed;Support Pillows;Position options;Skin to skin (w/interpreter at bedside)     Lactation Tools Discussed/Used Tools: Comfort gels   Consult Status Consult Status: Follow-up Date: 10/06/14 Follow-up type: In-patient    Nailea Whitehorn, Diamond Nickel 10/05/2014, 4:15 AM

## 2014-10-05 NOTE — Progress Notes (Signed)
At 0330, size of hematoma appears to have decreased to approximately 1 cm X 3 cm.  Patient indicates pain in that area has also decreased.  Continue to replace iced glove to area every1-2 hours.

## 2015-11-26 ENCOUNTER — Ambulatory Visit (HOSPITAL_COMMUNITY): Payer: Self-pay

## 2015-11-26 ENCOUNTER — Encounter: Payer: Self-pay | Admitting: Obstetrics and Gynecology

## 2015-11-26 ENCOUNTER — Ambulatory Visit (INDEPENDENT_AMBULATORY_CARE_PROVIDER_SITE_OTHER): Payer: Self-pay | Admitting: Obstetrics and Gynecology

## 2015-11-26 VITALS — BP 96/67 | HR 78 | Wt 135.9 lb

## 2015-11-26 DIAGNOSIS — Z758 Other problems related to medical facilities and other health care: Secondary | ICD-10-CM | POA: Insufficient documentation

## 2015-11-26 DIAGNOSIS — Z113 Encounter for screening for infections with a predominantly sexual mode of transmission: Secondary | ICD-10-CM

## 2015-11-26 DIAGNOSIS — Z3492 Encounter for supervision of normal pregnancy, unspecified, second trimester: Secondary | ICD-10-CM

## 2015-11-26 DIAGNOSIS — Z3490 Encounter for supervision of normal pregnancy, unspecified, unspecified trimester: Secondary | ICD-10-CM | POA: Insufficient documentation

## 2015-11-26 DIAGNOSIS — Z789 Other specified health status: Secondary | ICD-10-CM

## 2015-11-26 DIAGNOSIS — Z3482 Encounter for supervision of other normal pregnancy, second trimester: Secondary | ICD-10-CM

## 2015-11-26 DIAGNOSIS — O09899 Supervision of other high risk pregnancies, unspecified trimester: Secondary | ICD-10-CM | POA: Insufficient documentation

## 2015-11-26 LAB — POCT URINALYSIS DIP (DEVICE)
BILIRUBIN URINE: NEGATIVE
Glucose, UA: NEGATIVE mg/dL
Hgb urine dipstick: NEGATIVE
Ketones, ur: NEGATIVE mg/dL
Nitrite: NEGATIVE
PROTEIN: NEGATIVE mg/dL
Specific Gravity, Urine: 1.02 (ref 1.005–1.030)
Urobilinogen, UA: 0.2 mg/dL (ref 0.0–1.0)
pH: 6.5 (ref 5.0–8.0)

## 2015-11-26 NOTE — Progress Notes (Signed)
New OB Note  11/26/2015   Clinic: [facility]  Chief Complaint: NOB  Transfer of Care Patient: no  History of Present Illness: [name] is a 30 y.o. [id number] @ 15/2 weeks (EDC 1-20, based on Patient's last menstrual period was 08/11/2015 (exact date).), with the above CC. Preg complicated by has Supervision of normal pregnancy in second trimester on her problem list.close interval pregnancy  Patient denies any issues or problems with her prior pregnancies  Her periods were: regular She was using no method when she conceived.  She has Negative signs or symptoms of nausea/vomiting of pregnancy. She has Negative signs or symptoms of miscarriage or preterm labor She identifies Negative Zika risk factors for her and her partner  ROS: A 12-point review of systems was performed and negative, except as stated in the above HPI.  OBGYN History: As per HPI. OB History  Gravida Para Term Preterm AB Living  3 2 2     2   SAB TAB Ectopic Multiple Live Births        0 2    # Outcome Date GA Lbr Len/2nd Weight Sex Delivery Anes PTL Lv  3 Current           2 Term 10/04/14 [redacted]w[redacted]d 11:10 / 00:09 6 lb 7.7 oz (2.94 kg) M Vag-Spont Local  LIV  1 Term 07/03/09 [redacted]w[redacted]d  6 lb (2.722 kg) F Vag-Spont None N LIV      Any prior children are healthy, doing well, without any problems or issues: yes History of pap smears: No. Last pap smear 2016.  History of STIs: No   Past Medical History: Past Medical History:  Diagnosis Date  . Breast lump on right side at 1 o'clock position 05/18/2012   [facility]erred [facility] to [facility] for right breast ultrasound. Appointment scheduled for Friday, May 21, 2012 at 0930. Negative w/u. Routine screening at age 89 and BSE prior.   . Medical history non-contributory     Past Surgical History: Past Surgical History:  Procedure Laterality Date  . NO PAST SURGERIES      Family History:  Family History  Problem Relation Age of Onset  .  Hypertension Mother    She denies any history of mental retardation, birth defects or genetic disorders in her or the FOB's history  Social History:  Social History   Social History  . Marital status: Single    Spouse name: N/A  . Number of children: N/A  . Years of education: N/A   Occupational History  . Not on file.   Social History Main Topics  . Smoking status: Never Smoker  . Smokeless tobacco: Never Used  . Alcohol use No  . Drug use: No  . Sexual activity: Yes    Birth control/ protection: None   Other Topics Concern  . Not on file   Social History Narrative  . No narrative on file    Allergy: No Known Allergies  Health Maintenance:  Mammogram Up to Date: not applicable  Current Outpatient Medications: PNV  Physical Exam:   BP 96/67   Pulse 78   Wt 135 lb 14.4 oz (61.6 kg)   LMP 08/11/2015 (Exact Date)   BMI 25.68 kg/m  Body mass index is 25.68 kg/m. Fundal height: not applicable FHTs: 150s  General appearance: Well nourished, well developed female in no acute distress.  Neck:  Supple, normal appearance, and no thyromegaly  Cardiovascular: S1, S2 normal, no murmur, rub or gallop, regular rate  and rhythm Respiratory:  Clear to auscultation bilateral. Normal respiratory effort Abdomen: positive bowel sounds and no masses, hernias; diffusely non tender to palpation, non distended Breasts: breasts appear normal, no suspicious masses, no skin or nipple changes or axillary nodes, and negative inspection. Neuro/Psych:  Normal mood and affect.  Skin:  Warm and dry.  Lymphatic:  No inguinal lymphadenopathy.   Pelvic exam: is not limited by body habitus EGBUS: within normal limits, Vagina: within normal limits and with no blood in the vault, Cervix: normal appearing cervix without discharge or lesions, closed/long/high, Uterus:  enlarged, c/w 16 week size, and Adnexa:  normal adnexa and no mass, fullness, tenderness  Laboratory: none  Imaging:   none  Assessment: low risk OB  Plan: 1. Supervision of normal pregnancy in second trimester Routine care. Pt to consider genetic screening, including [place]/CF, after u/s is done. Pt set up for mfm anatomy and dating scan for 3-4wks - Prenatal Profile - GC/Chlamydia probe amp ([place])not at [facility] - Culture, OB Urine - Pain Mgmt, Profile 6 Conf w/o mM, U - [place] MFM OB COMP + 14 WK; Future  Problem list reviewed and updated.  Follow up in 4 weeks.  interpreter used  >50% of 20 min visit spent on counseling and coordination of care.     Cornelia Copa MD Attending Center for [facility])

## 2015-11-27 LAB — PRENATAL PROFILE (SOLSTAS)
ANTIBODY SCREEN: NEGATIVE
BASOS PCT: 0 %
Basophils Absolute: 0 cells/uL (ref 0–200)
Eosinophils Absolute: 113 cells/uL (ref 15–500)
Eosinophils Relative: 1 %
HEMATOCRIT: 37.5 % (ref 35.0–45.0)
HIV: NONREACTIVE
Hemoglobin: 12.5 g/dL (ref 11.7–15.5)
Hepatitis B Surface Ag: NEGATIVE
LYMPHS PCT: 15 %
Lymphs Abs: 1695 cells/uL (ref 850–3900)
MCH: 29.4 pg (ref 27.0–33.0)
MCHC: 33.3 g/dL (ref 32.0–36.0)
MCV: 88.2 fL (ref 80.0–100.0)
MPV: 9.4 fL (ref 7.5–12.5)
Monocytes Absolute: 678 cells/uL (ref 200–950)
Monocytes Relative: 6 %
Neutro Abs: 8814 cells/uL — ABNORMAL HIGH (ref 1500–7800)
Neutrophils Relative %: 78 %
Platelets: 242 10*3/uL (ref 140–400)
RBC: 4.25 MIL/uL (ref 3.80–5.10)
RDW: 14.3 % (ref 11.0–15.0)
RH TYPE: POSITIVE
Rubella: 25.6 Index — ABNORMAL HIGH (ref <0.90)
WBC: 11.3 10*3/uL — AB (ref 3.8–10.8)

## 2015-11-27 LAB — PAIN MGMT, PROFILE 6 CONF W/O MM, U
6 Acetylmorphine: NEGATIVE ng/mL (ref <10)
Alcohol Metabolites: NEGATIVE ng/mL (ref <500)
Amphetamines: NEGATIVE ng/mL (ref <500)
BARBITURATES: NEGATIVE ng/mL (ref <300)
Benzodiazepines: NEGATIVE ng/mL (ref <100)
COCAINE METABOLITE: NEGATIVE ng/mL (ref <150)
CREATININE: 132.1 mg/dL (ref >=20.0)
Marijuana Metabolite: NEGATIVE ng/mL (ref <20)
Methadone Metabolite: NEGATIVE ng/mL (ref <100)
OXYCODONE: NEGATIVE ng/mL (ref <100)
Opiates: NEGATIVE ng/mL (ref <100)
Oxidant: NEGATIVE ug/mL (ref <200)
PHENCYCLIDINE: NEGATIVE ng/mL (ref <25)
Please note:: 0
pH: 6.71 (ref 4.5–9.0)

## 2015-11-27 LAB — CULTURE, OB URINE: Organism ID, Bacteria: NO GROWTH

## 2015-11-27 LAB — GC/CHLAMYDIA PROBE AMP (~~LOC~~) NOT AT ARMC
Chlamydia: NEGATIVE
Neisseria Gonorrhea: NEGATIVE

## 2015-12-11 ENCOUNTER — Encounter (HOSPITAL_COMMUNITY): Payer: Self-pay | Admitting: Obstetrics

## 2015-12-11 ENCOUNTER — Encounter (HOSPITAL_COMMUNITY): Payer: Self-pay | Admitting: Obstetrics and Gynecology

## 2015-12-18 ENCOUNTER — Other Ambulatory Visit: Payer: Self-pay | Admitting: Obstetrics and Gynecology

## 2015-12-18 ENCOUNTER — Ambulatory Visit (HOSPITAL_COMMUNITY)
Admission: RE | Admit: 2015-12-18 | Discharge: 2015-12-18 | Disposition: A | Discharge status: Home / Self Care | Payer: Self-pay | Source: Ambulatory Visit | Attending: Obstetrics and Gynecology | Admitting: Obstetrics and Gynecology

## 2015-12-18 DIAGNOSIS — Z3482 Encounter for supervision of other normal pregnancy, second trimester: Secondary | ICD-10-CM | POA: Insufficient documentation

## 2015-12-18 DIAGNOSIS — Z3A20 20 weeks gestation of pregnancy: Secondary | ICD-10-CM | POA: Insufficient documentation

## 2015-12-18 DIAGNOSIS — Z3492 Encounter for supervision of normal pregnancy, unspecified, second trimester: Secondary | ICD-10-CM

## 2015-12-19 MED ORDER — MORPHINE SULFATE-NACL 0.5-0.9 MG/ML-% IV SOSY
PREFILLED_SYRINGE | INTRAVENOUS | Status: AC
  Filled 2015-12-19: qty 1

## 2015-12-19 MED ORDER — OXYTOCIN 10 UNIT/ML IJ SOLN
INTRAMUSCULAR | Status: AC
  Filled 2015-12-19: qty 4

## 2015-12-19 MED ORDER — PHENYLEPHRINE 8 MG IN D5W 100 ML (0.08MG/ML) PREMIX OPTIME
INJECTION | INTRAVENOUS | Status: AC
  Filled 2015-12-19: qty 100

## 2015-12-19 MED ORDER — FENTANYL CITRATE (PF) 100 MCG/2ML IJ SOLN
INTRAMUSCULAR | Status: AC
  Filled 2015-12-19: qty 2

## 2015-12-19 MED ORDER — ONDANSETRON HCL 4 MG/2ML IJ SOLN
INTRAMUSCULAR | Status: AC
  Filled 2015-12-19: qty 2

## 2015-12-24 ENCOUNTER — Ambulatory Visit (INDEPENDENT_AMBULATORY_CARE_PROVIDER_SITE_OTHER): Payer: Self-pay | Admitting: Family Medicine

## 2015-12-24 VITALS — BP 113/64 | HR 79 | Wt 138.0 lb

## 2015-12-24 DIAGNOSIS — O09899 Supervision of other high risk pregnancies, unspecified trimester: Secondary | ICD-10-CM

## 2015-12-24 DIAGNOSIS — Z789 Other specified health status: Secondary | ICD-10-CM

## 2015-12-24 DIAGNOSIS — Z3492 Encounter for supervision of normal pregnancy, unspecified, second trimester: Secondary | ICD-10-CM

## 2015-12-24 DIAGNOSIS — Z3482 Encounter for supervision of other normal pregnancy, second trimester: Secondary | ICD-10-CM

## 2015-12-24 LAB — POCT URINALYSIS DIP (DEVICE)
BILIRUBIN URINE: NEGATIVE
Glucose, UA: NEGATIVE mg/dL
KETONES UR: NEGATIVE mg/dL
NITRITE: NEGATIVE
Protein, ur: 100 mg/dL — AB
Specific Gravity, Urine: 1.025 (ref 1.005–1.030)
Urobilinogen, UA: 0.2 mg/dL (ref 0.0–1.0)
pH: 7 (ref 5.0–8.0)

## 2015-12-24 NOTE — Patient Instructions (Signed)

## 2015-12-24 NOTE — Progress Notes (Signed)
Subjective:  Jodi Murray is a 30 y.o. G3P2002 at 88101w2d being seen today for ongoing prenatal care.  She is currently monitored for the following issues for this low-risk pregnancy and has Supervision of normal pregnancy in second trimester; Short interval between pregnancies affecting pregnancy, antepartum; and Language barrier on her problem list.  Patient reports no complaints.  Contractions: Not present. Vag. Bleeding: None.  Movement: Present. Denies leaking of fluid.   The following portions of the patient's history were reviewed and updated as appropriate: allergies, current medications, past family history, past medical history, past social history, past surgical history and problem list. Problem list updated.  Objective:   Vitals:   12/24/15 0817  BP: 113/64  Pulse: 79  Weight: 138 lb (62.6 kg)    Fetal Status: Fetal Heart Rate (bpm): 152   Movement: Present     FHT:  152 BPM  Uterine Size: 21 cm  Presentation: unknown  Cervical Exam: Did not perform     Urinalysis:    pending  Assessment and Plan:  Pregnancy: G3P2002 at 109101w2d  1. Supervision of normal pregnancy in second trimester - Dating changed today, 20 wk US on 12/18/15 was 11 days off from LMP, using US dates. - Reviewed US results with the patient, normal anatomy, date changed, female. - Discussed signs/symptoms for emergency visits, including but not limited to: vaginal bleeding, leaking fluid, decreased FM.  2. Short interval between pregnancies affecting pregnancy, antepartum  3. Language barrier - Spanish speaking, interpretor present today.  Preterm labor symptoms and general obstetric precautions including but not limited to vaginal bleeding, contractions, leaking of fluid and fetal movement were reviewed in detail with the patient. Please refer to After Visit Summary for other counseling recommendations.  Follow up in 4 weeks.  [facility] Army Community HospitalElizabeth Woodland LesslieMumaw, [place]DO

## 2016-01-23 ENCOUNTER — Ambulatory Visit (INDEPENDENT_AMBULATORY_CARE_PROVIDER_SITE_OTHER): Payer: Self-pay | Admitting: Family Medicine

## 2016-01-23 VITALS — BP 94/63 | HR 87 | Wt 144.0 lb

## 2016-01-23 DIAGNOSIS — Z3492 Encounter for supervision of normal pregnancy, unspecified, second trimester: Secondary | ICD-10-CM

## 2016-01-23 DIAGNOSIS — O09899 Supervision of other high risk pregnancies, unspecified trimester: Secondary | ICD-10-CM

## 2016-01-23 DIAGNOSIS — Z23 Encounter for immunization: Secondary | ICD-10-CM

## 2016-01-23 DIAGNOSIS — Z3482 Encounter for supervision of other normal pregnancy, second trimester: Secondary | ICD-10-CM

## 2016-01-23 NOTE — Progress Notes (Signed)
   PRENATAL VISIT NOTE  Subjective:  Silverio Decampna Maria Vazquez-Garcia is a 30 y.o. G3P2002 at 7936w1d being seen today for ongoing prenatal care.  She is currently monitored for the following issues for this low-risk pregnancy and has Supervision of normal pregnancy in second trimester; Short interval between pregnancies affecting pregnancy, antepartum; and Language barrier on her problem list.  Patient reports no complaints.  Contractions: Not present. Vag. Bleeding: None.  Movement: Present. Denies leaking of fluid.   The following portions of the patient's history were reviewed and updated as appropriate: allergies, current medications, past family history, past medical history, past social history, past surgical history and problem list. Problem list updated.  Objective:   Vitals:   01/23/16 0813  BP: 94/63  Pulse: 87  Weight: 144 lb (65.3 kg)    Fetal Status: Fetal Heart Rate (bpm): 144 Fundal Height: 25 cm Movement: Present     General:  Alert, oriented and cooperative. Patient is in no acute distress.  Skin: Skin is warm and dry. No rash noted.   Cardiovascular: Normal heart rate noted  Respiratory: Normal respiratory effort, no problems with respiration noted  Abdomen: Soft, gravid, appropriate for gestational age. Pain/Pressure: Absent     Pelvic:  Cervical exam deferred        Extremities: Normal range of motion.  Edema: None  Mental Status: Normal mood and affect. Normal behavior. Normal judgment and thought content.   Urinalysis:      Assessment and Plan:  Pregnancy: G3P2002 at 5636w1d  1. Supervision of normal pregnancy in second trimester FHT and FH normal  2. Short interval between pregnancies affecting pregnancy, antepartum  3. Needs flu shot  - Flu Vaccine QUAD 36+ mos IM (Fluarix, Quad PF)  Preterm labor symptoms and general obstetric precautions including but not limited to vaginal bleeding, contractions, leaking of fluid and fetal movement were reviewed in detail  with the patient. Please refer to After Visit Summary for other counseling recommendations.  Return in about 4 weeks (around 02/20/2016).  Levie HeritageJacob J Adaline Trejos, DO

## 2016-02-20 ENCOUNTER — Ambulatory Visit (INDEPENDENT_AMBULATORY_CARE_PROVIDER_SITE_OTHER): Payer: Self-pay | Admitting: Advanced Practice Midwife

## 2016-02-20 VITALS — BP 96/61 | HR 80 | Wt 148.9 lb

## 2016-02-20 DIAGNOSIS — Z789 Other specified health status: Secondary | ICD-10-CM

## 2016-02-20 DIAGNOSIS — Z3403 Encounter for supervision of normal first pregnancy, third trimester: Secondary | ICD-10-CM

## 2016-02-20 DIAGNOSIS — O1203 Gestational edema, third trimester: Secondary | ICD-10-CM

## 2016-02-20 DIAGNOSIS — Z23 Encounter for immunization: Secondary | ICD-10-CM

## 2016-02-20 LAB — CBC
HEMATOCRIT: 33.7 % — AB (ref 35.0–45.0)
Hemoglobin: 11.1 g/dL — ABNORMAL LOW (ref 11.7–15.5)
MCH: 29.1 pg (ref 27.0–33.0)
MCHC: 32.9 g/dL (ref 32.0–36.0)
MCV: 88.2 fL (ref 80.0–100.0)
MPV: 10.3 fL (ref 7.5–12.5)
PLATELETS: 242 10*3/uL (ref 140–400)
RBC: 3.82 MIL/uL (ref 3.80–5.10)
RDW: 13.7 % (ref 11.0–15.0)
WBC: 12 10*3/uL — ABNORMAL HIGH (ref 3.8–10.8)

## 2016-02-20 LAB — GLUCOSE TOLERANCE, 1 HOUR (50G) W/O FASTING: Glucose, 1 Hr, gestational: 82 mg/dL (ref <140)

## 2016-02-20 MED ORDER — TETANUS-DIPHTH-ACELL PERTUSSIS 5-2.5-18.5 LF-MCG/0.5 IM SUSP
0.5000 mL | Freq: Once | INTRAMUSCULAR | Status: AC
  Administered 2016-02-20: 0.5 mL via INTRAMUSCULAR

## 2016-02-20 NOTE — Progress Notes (Signed)
[employer] # [phone] 28 wk labs today; glucola due at 0928 28 wk packet given  tdap vaccine given

## 2016-02-20 NOTE — Progress Notes (Signed)
   PRENATAL VISIT NOTE  Subjective:  Jodi Murray is a 30 y.o. G3P2002 at 2647w1d being seen today for ongoing prenatal care.  She is currently monitored for the following issues for this low-risk pregnancy and has Supervision of normal pregnancy in second trimester; Short interval between pregnancies affecting pregnancy, antepartum; and Language barrier on her problem list.  Patient reports BLE after standing for prolonged periods.  Contractions: Not present. Vag. Bleeding: None.  Movement: Present. Denies leaking of fluid.   The following portions of the patient's history were reviewed and updated as appropriate: allergies, current medications, past family history, past medical history, past social history, past surgical history and problem list. Problem list updated.  Objective:   Vitals:   02/20/16 0825  BP: 96/61  Pulse: 80  Weight: 148 lb 14.4 oz (67.5 kg)    Fetal Status: Fetal Heart Rate (bpm): 141 Fundal Height: 29 cm Movement: Present     General:  Alert, oriented and cooperative. Patient is in no acute distress.  Skin: Skin is warm and dry. No rash noted.   Cardiovascular: Normal heart rate noted  Respiratory: Normal respiratory effort, no problems with respiration noted  Abdomen: Soft, gravid, appropriate for gestational age. Pain/Pressure: Present     Pelvic:  Cervical exam deferred        Extremities: Normal range of motion.  Edema: None  Mental Status: Normal mood and affect. Normal behavior. Normal judgment and thought content.   Assessment and Plan:  Pregnancy: G3P2002 at 3547w1d  1. Supervision of low-risk first pregnancy, third trimester  - Glucose Tolerance, 1 HR (50g) w/o Fasting - CBC - RPR - HIV antibody (with reflex)  2. Language barrier Psychiatric nurse--[employer] # [phone]   3. Edema during pregnancy in third trimester --Edema only when standing for prolonged periods at end of the day.  Discussed putting feet up, walking vs standing or sitting,  use of compression socks/stockings.  Pt states understanding.  Trace edema on today's visit.  Pt BP 96/61 today.     Preterm labor symptoms and general obstetric precautions including but not limited to vaginal bleeding, contractions, leaking of fluid and fetal movement were reviewed in detail with the patient. Please refer to After Visit Summary for other counseling recommendations.  Return in about 2 weeks (around 03/05/2016).  Hurshel PartyLisa A Leftwich-Kirby, CNM

## 2016-02-20 NOTE — Patient Instructions (Signed)
Tercer trimestre de embarazo (Third Trimester of Pregnancy) El tercer trimestre comprende desde la semana29 hasta la semana42, es decir, desde el mes7 hasta el mes9. El tercer trimestre es un perodo en el que el feto crece rpidamente. Hacia el final del noveno mes, el feto mide alrededor de 20pulgadas (45cm) de largo y pesa entre 6 y 10 libras (2,700 y 4,500kg).  CAMBIOS EN EL ORGANISMO Su organismo atraviesa por muchos cambios durante el embarazo, y estos varan de una mujer a otra.   Seguir aumentando de peso. Es de esperar que aumente entre 25 y 35libras (11 y 16kg) hacia el final del embarazo.  Podrn aparecer las primeras estras en las caderas, el abdomen y las mamas.  Puede tener necesidad de orinar con ms frecuencia porque el feto baja hacia la pelvis y ejerce presin sobre la vejiga.  Debido al embarazo podr sentir acidez estomacal con frecuencia.  Puede estar estreida, ya que ciertas hormonas enlentecen los movimientos de los msculos que empujan los desechos a travs de los intestinos.  Pueden aparecer hemorroides o abultarse e hincharse las venas (venas varicosas).  Puede sentir dolor plvico debido al aumento de peso y a que las hormonas del embarazo relajan las articulaciones entre los huesos de la pelvis. El dolor de espalda puede ser consecuencia de la sobrecarga de los msculos que soportan la postura.  Tal vez haya cambios en el cabello que pueden incluir su engrosamiento, crecimiento rpido y cambios en la textura. Adems, a algunas mujeres se les cae el cabello durante o despus del embarazo, o tienen el cabello seco o fino. Lo ms probable es que el cabello se le normalice despus del nacimiento del beb.  Las mamas seguirn creciendo y le dolern. A veces, puede haber una secrecin amarilla de las mamas llamada calostro.  El ombligo puede salir hacia afuera.  Puede sentir que le falta el aire debido a que se expande el tero.  Puede notar que el feto  "baja" o lo siente ms bajo, en el abdomen.  Puede tener una prdida de secrecin mucosa con sangre. Esto suele ocurrir en el trmino de unos pocos das a una semana antes de que comience el trabajo de parto.  El cuello del tero se vuelve delgado y blando (se borra) cerca de la fecha de parto. QU DEBE ESPERAR EN LOS EXMENES PRENATALES  Le harn exmenes prenatales cada 2semanas hasta la semana36. A partir de ese momento le harn exmenes semanales. Durante una visita prenatal de rutina:  La pesarn para asegurarse de que usted y el feto estn creciendo normalmente.  Le tomarn la presin arterial.  Le medirn el abdomen para controlar el desarrollo del beb.  Se escucharn los latidos cardacos fetales.  Se evaluarn los resultados de los estudios solicitados en visitas anteriores.  Le revisarn el cuello del tero cuando est prxima la fecha de parto para controlar si este se ha borrado. Alrededor de la semana36, el mdico le revisar el cuello del tero. Al mismo tiempo, realizar un anlisis de las secreciones del tejido vaginal. Este examen es para determinar si hay un tipo de bacteria, estreptococo Grupo B. El mdico le explicar esto con ms detalle. El mdico puede preguntarle lo siguiente:  Cmo le gustara que fuera el parto.  Cmo se siente.  Si siente los movimientos del beb.  Si ha tenido sntomas anormales, como prdida de lquido, sangrado, dolores de cabeza intensos o clicos abdominales.  Si est consumiendo algn producto que contenga tabaco, como cigarrillos, tabaco   de mascar y cigarrillos electrnicos.  Si tiene alguna pregunta. Otros exmenes o estudios de deteccin que pueden realizarse durante el tercer trimestre incluyen lo siguiente:  Anlisis de sangre para controlar los niveles de hierro (anemia).  Controles fetales para determinar su salud, nivel de actividad y crecimiento. Si tiene alguna enfermedad o hay problemas durante el embarazo, le harn  estudios.  Prueba del VIH (virus de inmunodeficiencia humana). Si corre un riesgo alto, pueden realizarle una prueba de deteccin del VIH durante el tercer trimestre del embarazo. FALSO TRABAJO DE PARTO Es posible que sienta contracciones leves e irregulares que finalmente desaparecen. Se llaman contracciones de Braxton Hicks o falso trabajo de parto. Las contracciones pueden durar horas, das o incluso semanas, antes de que el verdadero trabajo de parto se inicie. Si las contracciones ocurren a intervalos regulares, se intensifican o se hacen dolorosas, lo mejor es que la revise el mdico.  SIGNOS DE TRABAJO DE PARTO   Clicos de tipo menstrual.  Contracciones cada 5minutos o menos.  Contracciones que comienzan en la parte superior del tero y se extienden hacia abajo, a la zona inferior del abdomen y la espalda.  Sensacin de mayor presin en la pelvis o dolor de espalda.  Una secrecin de mucosidad acuosa o con sangre que sale de la vagina. Si tiene alguno de estos signos antes de la semana37 del embarazo, llame a su mdico de inmediato. Debe concurrir al hospital para que la controlen inmediatamente. INSTRUCCIONES PARA EL CUIDADO EN EL HOGAR   Evite fumar, consumir hierbas, beber alcohol y tomar frmacos que no le hayan recetado. Estas sustancias qumicas afectan la formacin y el desarrollo del beb.  No consuma ningn producto que contenga tabaco, lo que incluye cigarrillos, tabaco de mascar y cigarrillos electrnicos. Si necesita ayuda para dejar de fumar, consulte al mdico. Puede recibir asesoramiento y otro tipo de recursos para dejar de fumar.  Siga las indicaciones del mdico en relacin con el uso de medicamentos. Durante el embarazo, hay medicamentos que son seguros de tomar y otros que no.  Haga ejercicio solamente como se lo haya indicado el mdico. Sentir clicos uterinos es un buen signo para detener la actividad fsica.  Contine comiendo alimentos sanos con  regularidad.  Use un sostn que le brinde buen soporte si le duelen las mamas.  No se d baos de inmersin en agua caliente, baos turcos ni saunas.  Use el cinturn de seguridad en todo momento mientras conduce.  No coma carne cruda ni queso sin cocinar; evite el contacto con las bandejas sanitarias de los gatos y la tierra que estos animales usan. Estos elementos contienen grmenes que pueden causar defectos congnitos en el beb.  Tome las vitaminas prenatales.  Tome entre 1500 y 2000mg de calcio diariamente comenzando en la semana20 del embarazo hasta el parto.  Si est estreida, pruebe un laxante suave (si el mdico lo autoriza). Consuma ms alimentos ricos en fibra, como vegetales y frutas frescos y cereales integrales. Beba gran cantidad de lquido para mantener la orina de tono claro o color amarillo plido.  Dese baos de asiento con agua tibia para aliviar el dolor o las molestias causadas por las hemorroides. Use una crema para las hemorroides si el mdico la autoriza.  Si tiene venas varicosas, use medias de descanso. Eleve los pies durante 15minutos, 3 o 4veces por da. Limite el consumo de sal en su dieta.  Evite levantar objetos pesados, use zapatos de tacones bajos y mantenga una buena postura.  Descanse   con las piernas elevadas si tiene calambres o dolor de cintura.  Visite a su dentista si no lo ha hecho durante el embarazo. Use un cepillo de dientes blando para higienizarse los dientes y psese el hilo dental con suavidad.  Puede seguir manteniendo relaciones sexuales, a menos que el mdico le indique lo contrario.  No haga viajes largos excepto que sea absolutamente necesario y solo con la autorizacin del mdico.  Tome clases prenatales para entender, practicar y hacer preguntas sobre el trabajo de parto y el parto.  Haga un ensayo de la partida al hospital.  Prepare el bolso que llevar al hospital.  Prepare la habitacin del beb.  Concurra a todas  las visitas prenatales segn las indicaciones de su mdico. SOLICITE ATENCIN MDICA SI:  No est segura de que est en trabajo de parto o de que ha roto la bolsa de las aguas.  Tiene mareos.  Siente clicos leves, presin en la pelvis o dolor persistente en el abdomen.  Tiene nuseas, vmitos o diarrea persistentes.  Observa una secrecin vaginal con mal olor.  Siente dolor al orinar. SOLICITE ATENCIN MDICA DE INMEDIATO SI:   Tiene fiebre.  Tiene una prdida de lquido por la vagina.  Tiene sangrado o pequeas prdidas vaginales.  Siente dolor intenso o clicos en el abdomen.  Sube o baja de peso rpidamente.  Tiene dificultad para respirar y siente dolor de pecho.  Sbitamente se le hinchan mucho el rostro, las manos, los tobillos, los pies o las piernas.  No ha sentido los movimientos del beb durante una hora.  Siente un dolor de cabeza intenso que no se alivia con medicamentos.  Su visin se modifica.   Esta informacin no tiene como fin reemplazar el consejo del mdico. Asegrese de hacerle al mdico cualquier pregunta que tenga.   Document Released: 01/22/2005 Document Revised: 05/05/2014 Elsevier Interactive Patient Education 2016 Elsevier Inc.  

## 2016-02-21 LAB — HIV ANTIBODY (ROUTINE TESTING W REFLEX): HIV 1&2 Ab, 4th Generation: NONREACTIVE

## 2016-02-21 LAB — RPR

## 2016-03-11 ENCOUNTER — Ambulatory Visit (INDEPENDENT_AMBULATORY_CARE_PROVIDER_SITE_OTHER): Payer: Self-pay | Admitting: Obstetrics & Gynecology

## 2016-03-11 VITALS — BP 101/60 | HR 81 | Wt 152.3 lb

## 2016-03-11 DIAGNOSIS — Z3492 Encounter for supervision of normal pregnancy, unspecified, second trimester: Secondary | ICD-10-CM

## 2016-03-11 LAB — POCT URINALYSIS DIP (DEVICE)
BILIRUBIN URINE: NEGATIVE
GLUCOSE, UA: NEGATIVE mg/dL
KETONES UR: NEGATIVE mg/dL
NITRITE: NEGATIVE
Protein, ur: NEGATIVE mg/dL
Specific Gravity, Urine: 1.01 (ref 1.005–1.030)
Urobilinogen, UA: 0.2 mg/dL (ref 0.0–1.0)
pH: 7 (ref 5.0–8.0)

## 2016-03-11 NOTE — Progress Notes (Signed)
   PRENATAL VISIT NOTE  Subjective:  Jodi Murray is a 30 y.o. G3P2002 at 4641w0d being seen today for ongoing prenatal care.  She is currently monitored for the following issues for this low-risk pregnancy and has Supervision of normal pregnancy in second trimester; Short interval between pregnancies affecting pregnancy, antepartum; and Language barrier on her problem list.  Patient reports no complaints.  Contractions: Not present. Vag. Bleeding: None.  Movement: Present. Denies leaking of fluid.   The following portions of the patient's history were reviewed and updated as appropriate: allergies, current medications, past family history, past medical history, past social history, past surgical history and problem list. Problem list updated.  Objective:   Vitals:   03/11/16 0824  BP: 101/60  Pulse: 81  Weight: 69.1 kg (152 lb 4.8 oz)    Fetal Status: Fetal Heart Rate (bpm): 135   Movement: Present     General:  Alert, oriented and cooperative. Patient is in no acute distress.  Skin: Skin is warm and dry. No rash noted.   Cardiovascular: Normal heart rate noted  Respiratory: Normal respiratory effort, no problems with respiration noted  Abdomen: Soft, gravid, appropriate for gestational age. Pain/Pressure: Present     Pelvic:  Cervical exam deferred        Extremities: Normal range of motion.  Edema: None  Mental Status: Normal mood and affect. Normal behavior. Normal judgment and thought content.   Assessment and Plan:  Pregnancy: G3P2002 at 7141w0d  There are no diagnoses linked to this encounter. Preterm labor symptoms and general obstetric precautions including but not limited to vaginal bleeding, contractions, leaking of fluid and fetal movement were reviewed in detail with the patient. Please refer to After Visit Summary for other counseling recommendations.  2 week return  Adam PhenixJames G Najmah Carradine, MD

## 2016-03-11 NOTE — Progress Notes (Signed)
Spanish video interpreter "[employer]" [phone]#[id number] used for visit

## 2016-03-27 ENCOUNTER — Ambulatory Visit (INDEPENDENT_AMBULATORY_CARE_PROVIDER_SITE_OTHER): Payer: Self-pay | Admitting: Obstetrics and Gynecology

## 2016-03-27 VITALS — BP 105/61 | HR 80 | Wt 154.2 lb

## 2016-03-27 DIAGNOSIS — Z3492 Encounter for supervision of normal pregnancy, unspecified, second trimester: Secondary | ICD-10-CM

## 2016-03-27 DIAGNOSIS — Z789 Other specified health status: Secondary | ICD-10-CM

## 2016-03-27 NOTE — Progress Notes (Signed)
Prenatal Visit Note Date: 03/27/2016 Clinic: [facility]  Subjective:  Jodi Murray is a 30 y.o. [id number] at 7265w2d being seen today for ongoing prenatal care.  She is currently monitored for the following issues for this low-risk pregnancy and has Supervision of normal pregnancy in second trimester; Short interval between pregnancies affecting pregnancy, antepartum; and Language barrier on her problem list.  Patient reports no complaints.   Contractions: Not present. Vag. Bleeding: None.  Movement: Present. Denies leaking of fluid.   The following portions of the patient's history were reviewed and updated as appropriate: allergies, current medications, past family history, past medical history, past social history, past surgical history and problem list. Problem list updated.  Objective:   Vitals:   03/27/16 0932  BP: 105/61  Pulse: 80  Weight: 154 lb 3.2 oz (69.9 kg)    Fetal Status: Fetal Heart Rate (bpm): 142 Fundal Height: 34 cm Movement: Present  Presentation: Vertex  General:  Alert, oriented and cooperative. Patient is in no acute distress.  Skin: Skin is warm and dry. No rash noted.   Cardiovascular: Normal heart rate noted  Respiratory: Normal respiratory effort, no problems with respiration noted  Abdomen: Soft, gravid, appropriate for gestational age. Pain/Pressure: Present     Pelvic:  Cervical exam deferred        Extremities: Normal range of motion.  Edema: None  Mental Status: Normal mood and affect. Normal behavior. Normal judgment and thought content.   Urinalysis:      Assessment and Plan:  Pregnancy: G3P2002 at 6165w2d  Routine care GBS nv LNG vs paragard interpreter used  Preterm labor symptoms and general obstetric precautions including but not limited to vaginal bleeding, contractions, leaking of fluid and fetal movement were reviewed in detail with the patient. Please refer to After Visit Summary for other counseling  recommendations.  Return in about 2 weeks (around 04/10/2016).   Delta Bingharlie Tennile Styles, MD

## 2016-04-10 ENCOUNTER — Ambulatory Visit (INDEPENDENT_AMBULATORY_CARE_PROVIDER_SITE_OTHER): Payer: Self-pay | Admitting: Family

## 2016-04-10 VITALS — BP 107/69 | HR 84 | Wt 156.8 lb

## 2016-04-10 DIAGNOSIS — Z3483 Encounter for supervision of other normal pregnancy, third trimester: Secondary | ICD-10-CM

## 2016-04-10 DIAGNOSIS — Z3482 Encounter for supervision of other normal pregnancy, second trimester: Secondary | ICD-10-CM

## 2016-04-10 DIAGNOSIS — O09899 Supervision of other high risk pregnancies, unspecified trimester: Secondary | ICD-10-CM

## 2016-04-10 DIAGNOSIS — Z113 Encounter for screening for infections with a predominantly sexual mode of transmission: Secondary | ICD-10-CM

## 2016-04-10 LAB — OB RESULTS CONSOLE GBS: STREP GROUP B AG: NEGATIVE

## 2016-04-10 LAB — OB RESULTS CONSOLE GC/CHLAMYDIA: Gonorrhea: NEGATIVE

## 2016-04-10 NOTE — Progress Notes (Signed)
Interpreter:  [phone]

## 2016-04-10 NOTE — Patient Instructions (Signed)
Infeccin por estreptococo del grupoB durante el embarazo (Group B Streptococcus Infection During Pregnancy) El estreptococo del grupo B (GBS, por sus siglas en ingls) es un tipo de bacteria que se encuentra en las mujeres sanas. No es el mismo que la bacteria que causa faringitis estreptoccica. Puede tener estreptococo del grupoB en la vagina, el recto o la vejiga. El estreptococo del grupoB no se transmite por contacto sexual; sin embargo, un beb puede contagiarse durante el parto, lo que puede ser peligroso para Engineer, maintenance (IT)el nio. El estreptococo del grupoB no es peligroso para usted y, generalmente, no provoca sntomas. El mdico puede hacerle anlisis de deteccin del estreptococo del grupoB entre la semana 35 y la 37de Psychiatristembarazo. Esta bacteria es peligrosa solamente durante el parto, de modo que no es necesario hacer pruebas de deteccin con anterioridad. Es posible Nurse, children'stener estreptococo del grupoB durante el embarazo y no transmitrselo nunca al beb. Si los resultados de los anlisis de deteccin del estreptococo del grupoB son positivos, el mdico puede recomendar que se le administren antibiticos durante el parto para asegurarse de que el beb est sano. FACTORES DE RIESGO Tiene ms probabilidades de transmitirle el estreptococo del grupoB al beb si:   Se le rompe la bolsa de aguas (ruptura de [place]membranas) o si el trabajo de parto comienza antes de las 37semanas.  Se le rompe la bolsa 18horas antes del parto.  Transmiti el estreptococo del grupoB en un embarazo anterior.  Tiene una infeccin de las vas urinarias causada por el estreptococo del grupoB en cualquier momento durante el embarazo.  Tiene fiebre durante el [place]abajo de [place]. SNTOMAS La mayora de las mujeres que tienen estreptococo del grupoB no presentan sntomas. Si tiene una infeccin de las vas urinarias causada por el estreptococo del grupoB, es posible que orine con frecuencia o que tenga dolor al Geographical information systems officerorinar y Mineolafiebre.  Generalmente, los bebs que tienen estreptococo del grupoB presentan sntomas en el trmino de los 7das posteriores al nacimiento. Los sntomas pueden incluir:   Problemas respiratorios.  Problemas cardacos y de presin arterial.  Problemas digestivos y renales. DIAGNSTICO Se recomienda que todas las embarazadas se hagan exmenes de rutina para la deteccin del estreptococo del grupoB. El mdico toma [place]una muestra de secrecin de la vagina y el recto con un hisopo, que luego se enva al laboratorio para Landscape architectdetectar la presencia de estreptococo del grupoB. Tambin pueden tomarle una muestra de orina para controlar si hay bacterias.  TRATAMIENTO Si el resultado de los ARAMARK Corporationanlisis de deteccin del estreptococo del grupoB es positivo, tal vez deba recibir tratamiento con un antibitico durante el [place]abajo de [place]parto. En cuanto comience el trabajo de parto o se produzca la ruptura de las Wevermembranas, recibir el antibitico a travs de una va [place]intravenosa. Seguir recibiendo Regions Financial Corporationeste medicamento hasta despus del [place]parto. No necesita antibiticos si el parto es por cesrea. Si el beb muestra signos o sntomas de estreptococo del grupoB despus del parto, tambin puede recibir tratamiento con antibiticos. INSTRUCCIONES PARA EL CUIDADO EN EL HOGAR   Tome todos los medicamentos tal como el Bed Bath & Beyondmdico le recet. Tome los medicamentos solamente como se lo hayan indicado.  Contine con las visitas y cuidados prenatales.  Cumpla con todas las visitas de control. SOLICITE ATENCIN MDICA SI:   Siente dolor al ConocoPhillipsorinar.  Tiene necesidad de orinar con frecuencia.  Tiene fiebre. SOLICITE ATENCIN MDICA DE INMEDIATO SI:   Se produce la ruptura de las [place]membranas.  Comienza el trabajo de [place]parto. Esta informacin no tiene como fin  reemplazar el consejo del mdico. Asegrese de hacerle al mdico cualquier pregunta que tenga. Document Released: 10/01/2007 Document Revised: 04/19/2013 Elsevier Interactive Patient  Education  2017 [employer]

## 2016-04-10 NOTE — Progress Notes (Signed)
   PRENATAL VISIT NOTE  Subjective:  Jodi Murray is a 30 y.o. G3P2002 at 8685w2d being seen today for ongoing prenatal care.  She is currently monitored for the following issues for this low-risk pregnancy and has Supervision of normal pregnancy in second trimester; Short interval between pregnancies affecting pregnancy, antepartum; and Language barrier on her problem list.  Patient reports no complaints.  Contractions: Not present.  .  Movement: Present. Denies leaking of fluid.   The following portions of the patient's history were reviewed and updated as appropriate: allergies, current medications, past family history, past medical history, past social history, past surgical history and problem list. Problem list updated.  Objective:   Vitals:   04/10/16 0837  BP: 107/69  Pulse: 84  Weight: 156 lb 12.8 oz (71.1 kg)    Fetal Status: Fetal Heart Rate (bpm): 135 Fundal Height: 36 cm Movement: Present     General:  Alert, oriented and cooperative. Patient is in no acute distress.  Skin: Skin is warm and dry. No rash noted.   Cardiovascular: Normal heart rate noted  Respiratory: Normal respiratory effort, no problems with respiration noted  Abdomen: Soft, gravid, appropriate for gestational age. Pain/Pressure: Absent     Pelvic:  Cervical exam performed      FT/Thick  Extremities: Normal range of motion.  Edema: Trace  Mental Status: Normal mood and affect. Normal behavior. Normal judgment and thought content.   Assessment and Plan:  Pregnancy: G3P2002 at 685w2d  1. Encounter for supervision of other normal pregnancy in third trimester - Culture, beta strep (group b only) - [facility]Chlam[facility]  Preterm labor symptoms and general obstetric precautions including but not limited to vaginal bleeding, contractions, leaking of fluid and fetal movement were reviewed in detail with the patient. Please refer to After Visit Summary for other counseling  recommendations.  Return in 1 week (on 04/17/2016).   Eino FarberWalidah Kennith GainN Karim, CNM

## 2016-04-11 LAB — GC/CHLAMYDIA PROBE AMP (~~LOC~~) NOT AT ARMC
CHLAMYDIA, DNA PROBE: NEGATIVE
NEISSERIA GONORRHEA: NEGATIVE

## 2016-04-12 LAB — CULTURE, BETA STREP (GROUP B ONLY)

## 2016-04-17 ENCOUNTER — Ambulatory Visit (INDEPENDENT_AMBULATORY_CARE_PROVIDER_SITE_OTHER): Payer: Self-pay | Admitting: Obstetrics & Gynecology

## 2016-04-17 VITALS — BP 105/62 | HR 80 | Wt 157.2 lb

## 2016-04-17 DIAGNOSIS — Z3483 Encounter for supervision of other normal pregnancy, third trimester: Secondary | ICD-10-CM

## 2016-04-17 DIAGNOSIS — Z3480 Encounter for supervision of other normal pregnancy, unspecified trimester: Secondary | ICD-10-CM

## 2016-04-17 NOTE — Patient Instructions (Signed)
Parto vaginal (Vaginal Delivery) Durante el parto, el mdico la ayudar a dar a luz a su beb. En elparto vaginal, deber pujar para que el beb salga por la vagina. Sin embargo, antes de que pueda sacar al beb, es necesario que ocurran ciertas cosas. La abertura del tero (cuello del tero) tiene que ablandarse, hacerse ms delgado y abrirse (dilatar) hasta que llegue a 10 cm. Adems, el beb tiene que bajar desde el tero a la vagina. SIGNOS DE TRABAJO DE PARTO  El mdico tendr primero que asegurarse de que usted est en trabajo de parto. Algunos signos son:   Eliminar lo que se llama tapn mucoso antes del inicio del trabajo de parto. Este es una pequea cantidad de mucosidad teida con sangre.  Tener contracciones uterinas regulares y dolorosas.   El tiempo entre las contracciones debe acortarse  Las molestias y el dolor se harn ms intensos gradualmente.  El dolor de las contracciones empeora al caminar y no se alivia con el reposo.   El cuello del tero se hace mas delgado (se borra) y se dilata. ANTES DEL PARTO Una vez que se inicie el trabajo de parto y sea admitida en el hospital o sanatorio, el mdico podr hacer lo siguiente:   Realizar un examen fsico.  Controlar si hay complicaciones relacionadas con el trabajo de parto.  Verificar su presin arterial, temperatura y pulso y la frecuencia cardaca (signos vitales).   Determinar si se ha roto el saco amnitico y cundo ha ocurrido.  Realizar un examen vaginal (utilizando un guante estril y un lubricante) para determinar: ? La posicin (presentacin) del beb. El beb se presenta con la cabeza primero (vertex) en el canal de parto (vagina), o estn los pies o las nalgas primero (de nalgas)? ? El nivel (estacin) de la cabeza del beb dentro del canal de parto. ? El borramiento y la dilatacin del cuello uterino  El monitor fetal electrnico generalmente se coloca sobre el abdomen al llegar. Se utiliza para  controlar las contracciones y la frecuencia cardaca del beb. ? Cuando el monitor est en el abdomen (monitor fetal externo), slo toma la frecuencia y la duracin de las contracciones. No informa acerca de la intensidad de las contracciones. ? Si el mdico necesita saber exactamente la intensidad de las contracciones o cul es la frecuencia cardaca del beb, colocar un monitor interno en la vagina y el tero. El mdico comentar los riesgos y los beneficios de usar un monitor interno y le pedir autorizacin antes de colocar el dispositivo. ? El monitoreo fetal continuo ser necesario si le han aplicado una epidural, si le administran ciertos medicamentos (como oxitocina) y si tiene complicaciones del embarazo o del trabajo de parto.  Podrn colocarle una va intravenosa en una vena del brazo para suministrarle lquidos y medicamentos, si es necesario. TRES ETAPAS DEL TRABAJO DE PARTO Y EL PARTO El trabajo de parto y el parto normales se dividen en tres etapas. Primera etapa Esta etapa comienza cuando comienzan las contracciones regulares y el cuello comienza a borrarse y dilatarse. Finaliza cuando el cuello est completamente abierto (completamente dilatado). La primera etapa es la etapa ms larga del trabajo de parto y puede durar desde 3 horas a 15 horas.  Algunos mtodos estn disponibles para ayudar con el dolor del parto. Usted y su mdico decidirn qu opcin es la mejor para usted. Las opciones incluyen:   Medicamentos narcticos. Estos son medicamentos fuertes que usted puede recibir a travs de una va intravenosa o   como inyeccin en el msculo. Estos medicamentos alivian el dolor pero no hacen que desaparezca completamente.  Epidural. Se administra un medicamento a travs de un tubo delgado que se inserta en la espalda. El medicamento adormece la parte inferior del cuerpo y evita el dolor en esa zona.  Bloqueo paracervical Es una inyeccin de un anestsico en cada lado del cuello  uterino.  Usted podr pedir un parto natural, que implica que no se usen analgsicos ni epidural durante el parto y el trabajo de parto. En cambio, podr tener otro tipo de ayuda como ejercicios respiratorios para hacer frente al dolor. Segunda etapa La segunda etapa del trabajo de parto comienza cuando el cuello se ha dilatado completamente a 10 cm. Contina hasta que usted puja al beb hacia abajo, por el canal de parto, y el beb nace. Esta etapa puede durar slo algunos minutos o algunas horas.  La posicin del la cabeza del beb a medida que pasa por el canal de parto, es informada como un nmero, llamado estacin. Si la cabeza del beb no ha iniciado su descenso, la estacin se describe como que est en menos 3 (-3). Cuando la cabeza del beb est en la estacin cero, est en el medio del canal de parto y se encaja en la pelvis. La estacin en la que se encuentra el beb indica el progreso de la segunda etapa del trabajo de parto.  Cuando el beb nace, el mdico lo sostendr con la cabeza hacia abajo para evitar que el lquido amnitico, el moco y la sangre entren en los pulmones del beb. La boca y la nariz del beb podrn ser succionadas con un pequeo bulbo para retirar todo lquido adicional.  El mdico podr colocar al beb sobre su estmago. Es importante evitar que el beb tome fro. Para hacerlo, el mdico secar al beb, lo colocar directamente sobre su piel, (sin mantas entre usted y el beb) y lo cubrir con mantas secas y tibias.  Se corta el cordn umbilical. Tercera etapa Durante la tercera etapa del trabajo de parto, el mdico sacar la placenta (alumbramiento) y se asegurar de que el sangrado est controlado. La salida de la placenta generalmente demora 5 minutos pero puede tardar hasta 30 minutos. Luego de la salida de la placenta, le darn un medicamento por va intravenosa o inyectable para ayudar a contraer el tero y controlar el sangrado. Si planea amamantar al beb,  puede intentar en este momento Luego de la salida de la placenta, el tero debe contraerse y quedar muy firme. Si el tero no queda firme, el mdico lo masajear. Esto es importante debido a que la contraccin del tero ayuda a cortar el sangrado en el sitio en que la placenta estaba unida al tero. Si el tero no se contrae adecuadamente ni permanece firme, podr causar un sangrado abundante. Si hay mucho sangrado, podrn darle medicamentos para contraer el tero y detener el sangrado.  Esta informacin no tiene como fin reemplazar el consejo del mdico. Asegrese de hacerle al mdico cualquier pregunta que tenga. Document Released: 03/27/2008 Document Revised: 05/05/2014 Elsevier Interactive Patient Education  2017 Elsevier Inc.  

## 2016-04-17 NOTE — Progress Notes (Signed)
   PRENATAL VISIT NOTE  Subjective:  Jodi Murray is a 30 y.o. G3P2002 at 162w2d being seen today for ongoing prenatal care.  She is currently monitored for the following issues for this low-risk pregnancy and has Supervision of normal pregnancy in second trimester; Short interval between pregnancies affecting pregnancy, antepartum; and Language barrier on her problem list.  Patient reports no complaints.  Contractions: Not present. Vag. Bleeding: None.  Movement: Present. Denies leaking of fluid.   The following portions of the patient's history were reviewed and updated as appropriate: allergies, current medications, past family history, past medical history, past social history, past surgical history and problem list. Problem list updated.  Objective:   Vitals:   04/17/16 1358  BP: 105/62  Pulse: 80  Weight: 157 lb 3.2 oz (71.3 kg)    Fetal Status: Fetal Heart Rate (bpm): 129 Fundal Height: 37 cm Movement: Present     General:  Alert, oriented and cooperative. Patient is in no acute distress.  Skin: Skin is warm and dry. No rash noted.   Cardiovascular: Normal heart rate noted  Respiratory: Normal respiratory effort, no problems with respiration noted  Abdomen: Soft, gravid, appropriate for gestational age. Pain/Pressure: Absent     Pelvic:  Cervical exam deferred        Extremities: Normal range of motion.  Edema: Trace  Mental Status: Normal mood and affect. Normal behavior. Normal judgment and thought content.   Assessment and Plan:  Pregnancy: G3P2002 at 262w2d  1. Encounter for supervision of normal pregnancy in third  trimester, unspecified gravidity   Term labor symptoms and general obstetric precautions including but not limited to vaginal bleeding, contractions, leaking of fluid and fetal movement were reviewed in detail with the patient. Please refer to After Visit Summary for other counseling recommendations.  Return in about 1 week (around  04/24/2016).   Adam PhenixJames G Arnold, MD

## 2016-04-24 ENCOUNTER — Encounter: Payer: Self-pay | Admitting: Family Medicine

## 2016-04-24 ENCOUNTER — Ambulatory Visit (INDEPENDENT_AMBULATORY_CARE_PROVIDER_SITE_OTHER): Payer: Self-pay | Admitting: Medical

## 2016-04-24 VITALS — BP 113/64 | HR 79 | Wt 158.4 lb

## 2016-04-24 DIAGNOSIS — Z3480 Encounter for supervision of other normal pregnancy, unspecified trimester: Secondary | ICD-10-CM

## 2016-04-24 DIAGNOSIS — Z3483 Encounter for supervision of other normal pregnancy, third trimester: Secondary | ICD-10-CM

## 2016-04-24 NOTE — Progress Notes (Signed)
   PRENATAL VISIT NOTE  Subjective:  Jodi Murray is a 30 y.o. G3P2002 at 5517w2d being seen today for ongoing prenatal care.  She is currently monitored for the following issues for this low-risk pregnancy and has Supervision of normal pregnancy, antepartum; Short interval between pregnancies affecting pregnancy, antepartum; and Language barrier on her problem list.  Patient reports no complaints.  Contractions: Not present. Vag. Bleeding: None.  Movement: Present. Denies leaking of fluid.   The following portions of the patient's history were reviewed and updated as appropriate: allergies, current medications, past family history, past medical history, past social history, past surgical history and problem list. Problem list updated.  Objective:   Vitals:   04/24/16 1100  BP: 113/64  Pulse: 79  Weight: 158 lb 6.4 oz (71.8 kg)    Fetal Status: Fetal Heart Rate (bpm): 130 Fundal Height: 38 cm Movement: Present     General:  Alert, oriented and cooperative. Patient is in no acute distress.  Skin: Skin is warm and dry. No rash noted.   Cardiovascular: Normal heart rate noted  Respiratory: Normal respiratory effort, no problems with respiration noted  Abdomen: Soft, gravid, appropriate for gestational age. Pain/Pressure: Present     Pelvic:  Cervical exam deferred        Extremities: Normal range of motion.  Edema: Trace  Mental Status: Normal mood and affect. Normal behavior. Normal judgment and thought content.   Assessment and Plan:  Pregnancy: G3P2002 at 9317w2d  1. Supervision of other normal pregnancy, antepartum - No complaints, doing well - GBS negative - discussed with patient   Term labor symptoms and general obstetric precautions including but not limited to vaginal bleeding, contractions, leaking of fluid and fetal movement were reviewed in detail with the patient. Please refer to After Visit Summary for other counseling recommendations.  Return in about 1 week  (around 05/01/2016) for LOB.   Marny LowensteinJulie N Eric Morganti, PA-C

## 2016-04-24 NOTE — Patient Instructions (Signed)
Contracciones de Physiological scientist (Braxton Hicks Contractions) Durante el [place], pueden presentarse contracciones uterinas que no siempre indican que est en [place]. QU SON LAS CONTRACCIONES DE BRAXTON HICKS? Las [employer] se presentan antes del [place] de [place] se conocen como contracciones de [place] o falso trabajo de [place]. Hacia el final del embarazo (32 a 34semanas), estas contracciones pueden aparecen con ms frecuencia y volverse ms intensas. No corresponden al [place] de parto verdadero porque estas contracciones no producen el agrandamiento (la dilatacin) y el afinamiento del cuello del tero. Algunas veces, es difcil distinguirlas del trabajo de parto verdadero porque en algunos casos pueden ser [employer], y las personas tienen diferentes niveles de tolerancia al [employer]. No debe sentirse avergonzada si concurre al hospital con falso trabajo de [place]. En ocasiones, la nica forma de saber si el trabajo de parto es verdadero es que el mdico determine si hay cambios en el cuello del tero. Si no hay problemas prenatales u otras complicaciones de salud asociadas con el embarazo, no habr inconvenientes si la envan a su casa con falso trabajo de parto y espera que comience el verdadero. Victor DEL VERDADERO Falso trabajo de parto   Las contracciones del falso trabajo de parto duran menos y no son tan intensas como las verdaderas.  Generalmente son irregulares.  A menudo, se sienten en la parte delantera de la parte baja del abdomen y en la ingle,  y pueden desaparecer cuando camina o cambia de posicin mientras est acostada.  Las contracciones se vuelven ms dbiles y su duracin es Garment/textile technologist a medida que el tiempo transcurre.  Por lo general, no se hacen progresivamente ms intensas, regulares y Magazine features editor entre s como en el caso del [place] de parto verdadero. Antionette Fairy de parto   Las contracciones del verdadero  trabajo de parto duran de 72 a 70segundos, son muy regulares y suelen volverse ms intensas, y [place] su frecuencia.  No desaparecen cuando camina.  La molestia generalmente se siente en la parte superior del tero y se extiende hacia la zona inferior del abdomen y [employer] cintura.  El mdico podr examinarla para determinar si el trabajo de parto es verdadero. El examen mostrar si el cuello del tero se est dilatando y [place]. LO QUE DEBE RECORDAR  [place] haciendo los ejercicios habituales y siga otras indicaciones que el mdico le d.  Tome todos los medicamentos como le indic el mdico.  Consulting civil engineer a las visitas prenatales regulares.  Coma y beba con moderacin si cree que est en trabajo de parto.  Si las contracciones de [employer] provocan incomodidad:  Cambie de posicin: si est acostada o descansando, camine; si est caminando, descanse.  Sintese y descanse en una baera con agua tibia.  Beba 2 o 3vasos de [place]. La deshidratacin puede provocar contracciones.  Respire lenta y profundamente varias veces por hora. [place]? Solicite atencin mdica de inmediato si:  Las contracciones se intensifican, se hacen ms regulares y Industrial/product designer s.  Tiene una prdida de lquido por la vagina.  Tiene fiebre.  Elimina mucosidad manchada con [place].  Tiene una hemorragia vaginal abundante.  Tiene dolor abdominal permanente.  Tiene un dolor en la zona lumbar que nunca tuvo antes.  Siente que la cabeza del beb empuja hacia abajo y ejerce presin en la zona plvica.  El beb no se mueve Administrator, Civil Service. Esta informacin no tiene Psychologist, clinical  consejo del médico. Asegúrese de hacerle al médico cualquier pregunta que tenga. °Document Released: 01/22/2005 Document Revised: 08/06/2015 Document Reviewed: 01/24/2013 °Elsevier Interactive Patient Education © 2017 Elsevier Inc. °Evaluación de los movimientos fetales    °(Fetal Movement Counts) °Nombre del paciente: __________________________________________________ Fecha de parto estimada: ____________________ °La evaluación de los movimientos fetales es muy recomendable en los embarazos de alto riesgo, pero también es una buena idea que lo hagan todas las embarazadas. El médico le indicará que comience a contarlos a las 28 semanas de embarazo. Los movimientos fetales suelen aumentar:  °· Después de una comida completa. °· Después de la actividad física. °· Después de comer o beber algo dulce o frío. °· En reposo. °Preste atención cuando sienta que el bebé está más activo. Esto le ayudará a notar un patrón de ciclos de vigilia y sueño de su bebé y cuáles son los factores que contribuyen a un aumento de los movimientos fetales. Es importante llevar a cabo un recuento de movimientos fetales, al mismo tiempo cada día, cuando el bebé normalmente está más activo.  °CÓMO CONTAR LOS MOVIMIENTOS FETALES °1. Busque un lugar tranquilo y cómodo para sentarse o recostarse sobre el lado izquierdo. Al recostarse sobre su lado izquierdo, le proporciona una mejor circulación de sangre y oxígeno al bebé. °2. Anote el día y la hora en una hoja de papel o en un diario. °3. Comience contando las pataditas, revoloteos, chasquidos, vueltas o pinchazos en un período de 2 horas. Debe sentir al menos 10 movimientos en 2 horas. °4. Si no siente 10 movimientos en 2 horas, espere 2 ó 3 horas y cuente de nuevo. Busque cambios en el patrón o si no cuenta lo suficiente en 2 horas. °SOLICITE ATENCIÓN MÉDICA SI:  °· Siente menos de 10 pataditas en 2 horas, en dos intentos. °· No hay movimientos durante una hora. °· El patrón se modifica o le lleva más tiempo cada día contar las 10 pataditas. °· Siente que el bebé no se mueve como lo hace habitualmente. °Fecha: ____________ Movimientos: ____________ Hora de inicio: ____________ Hora de finalización: ____________  °Fecha: ____________ Movimientos: ____________  Hora de inicio: ____________ Hora de finalización: ____________  °Fecha: ____________ Movimientos: ____________ Hora de inicio: ____________ Hora de finalización: ____________  °Fecha: ____________ Movimientos: ____________ Hora de inicio: ____________ Hora de finalización: ____________  °Fecha: ____________ Movimientos: ____________ Hora de inicio: ____________ Hora de finalización: ____________  °Fecha: ____________ Movimientos: ____________ Hora de inicio: ____________ Hora de finalización: ____________  °Fecha: ____________ Movimientos: ____________ Hora de inicio: ____________ Hora de finalización: ____________  °Fecha: ____________ Movimientos: ____________ Hora de inicio: ____________ Hora de finalización: ____________  °Fecha: ____________ Movimientos: ____________ Hora de inicio: ____________ Hora de finalización: ____________  °Fecha: ____________ Movimientos: ____________ Hora de inicio: ____________ Hora de finalización: ____________  °Fecha: ____________ Movimientos: ____________ Hora de inicio: ____________ Hora de finalización: ____________  °Fecha: ____________ Movimientos: ____________ Hora de inicio: ____________ Hora de finalización: ____________  °Fecha: ____________ Movimientos: ____________ Hora de inicio: ____________ Hora de finalización: ____________  °Fecha: ____________ Movimientos: ____________ Hora de inicio: ____________ Hora de finalización: ____________  °Fecha: ____________ Movimientos: ____________ Hora de inicio: ____________ Hora de finalización: ____________  °Fecha: ____________ Movimientos: ____________ Hora de inicio: ____________ Hora de finalización: ____________  °Fecha: ____________ Movimientos: ____________ Hora de inicio: ____________ Hora de finalización: ____________  °Fecha: ____________ Movimientos: ____________ Hora de inicio: ____________ Hora de finalización: ____________  °Fecha: ____________ Movimientos: ____________ Hora de inicio: ____________ Hora de  finalización: ____________  °Fecha: ____________ Movimientos: ____________ Hora   de inicio: ____________ Hora de finalización: ____________  °Fecha: ____________ Movimientos: ____________ Hora de inicio: ____________ Hora de finalización: ____________  °Fecha: ____________ Movimientos: ____________ Hora de inicio: ____________ Hora de finalización: ____________  °Fecha: ____________ Movimientos: ____________ Hora de inicio: ____________ Hora de finalización: ____________  °Fecha: ____________ Movimientos: ____________ Hora de inicio: ____________ Hora de finalización: ____________  °Fecha: ____________ Movimientos: ____________ Hora de inicio: ____________ Hora de finalización: ____________  °Fecha: ____________ Movimientos: ____________ Hora de inicio: ____________ Hora de finalización: ____________  °Fecha: ____________ Movimientos: ____________ Hora de inicio: ____________ Hora de finalización: ____________  °Fecha: ____________ Movimientos: ____________ Hora de inicio: ____________ Hora de finalización: ____________  °Fecha: ____________ Movimientos: ____________ Hora de inicio: ____________ Hora de finalización: ____________  °Fecha: ____________ Movimientos: ____________ Hora de inicio: ____________ Hora de finalización: ____________  °Fecha: ____________ Movimientos: ____________ Hora de inicio: ____________ Hora de finalización: ____________  °Fecha: ____________ Movimientos: ____________ Hora de inicio: ____________ Hora de finalización: ____________  °Fecha: ____________ Movimientos: ____________ Hora de inicio: ____________ Hora de finalización: ____________  °Fecha: ____________ Movimientos: ____________ Hora de inicio: ____________ Hora de finalización: ____________  °Fecha: ____________ Movimientos: ____________ Hora de inicio: ____________ Hora de finalización: ____________  °Fecha: ____________ Movimientos: ____________ Hora de inicio: ____________ Hora de finalización: ____________  °Fecha:  ____________ Movimientos: ____________ Hora de inicio: ____________ Hora de finalización: ____________  °Fecha: ____________ Movimientos: ____________ Hora de inicio: ____________ Hora de finalización: ____________  °Fecha: ____________ Movimientos: ____________ Hora de inicio: ____________ Hora de finalización: ____________  °Fecha: ____________ Movimientos: ____________ Hora de inicio: ____________ Hora de finalización: ____________  °Fecha: ____________ Movimientos: ____________ Hora de inicio: ____________ Hora de finalización: ____________  °Fecha: ____________ Movimientos: ____________ Hora de inicio: ____________ Hora de finalización: ____________  °Fecha: ____________ Movimientos: ____________ Hora de inicio: ____________ Hora de finalización: ____________  °Fecha: ____________ Movimientos: ____________ Hora de inicio: ____________ Hora de finalización: ____________  °Fecha: ____________ Movimientos: ____________ Hora de inicio: ____________ Hora de finalización: ____________  °Fecha: ____________ Movimientos: ____________ Hora de inicio: ____________ Hora de finalización: ____________  °Fecha: ____________ Movimientos: ____________ Hora de inicio: ____________ Hora de finalización: ____________  °Fecha: ____________ Movimientos: ____________ Hora de inicio: ____________ Hora de finalización: ____________  °Fecha: ____________ Movimientos: ____________ Hora de inicio: ____________ Hora de finalización: ____________  °Fecha: ____________ Movimientos: ____________ Hora de inicio: ____________ Hora de finalización: ____________  °Fecha: ____________ Movimientos: ____________ Hora de inicio: ____________ Hora de finalización: ____________  °Fecha: ____________ Movimientos: ____________ Hora de inicio: ____________ Hora de finalización: ____________  °Fecha: ____________ Movimientos: ____________ Hora de inicio: ____________ Hora de finalización: ____________  °Fecha: ____________ Movimientos: ____________ Hora  de inicio: ____________ Hora de finalización: ____________  °Fecha: ____________ Movimientos: ____________ Hora de inicio: ____________ Hora de finalización: ____________  °Fecha: ____________ Movimientos: ____________ Hora de inicio: ____________ Hora de finalización: ____________  °Esta información no tiene como fin reemplazar el consejo del médico. Asegúrese de hacerle al médico cualquier pregunta que tenga. °Document Released: 07/22/2007 Document Revised: 03/31/2012 °Elsevier Interactive Patient Education © 2017 Elsevier Inc. ° °

## 2016-04-24 NOTE — Progress Notes (Signed)
Used [employer] Video interpreter [place] [id number].

## 2016-04-28 NOTE — L&D Delivery Note (Signed)
Delivery Note At 12:11 AM a viable female was delivered via Vaginal, Spontaneous Delivery (Presentation: vertex, LOA ).  APGAR: 8,9 ; weight pending 7 lb 3.2 oz .   Placenta status: spontaneous, intact  Cord: 3 vessels with the following complications: nuchal x1.  Cord pH: not obtained  Anesthesia: Fentanyl Episiotomy: None Lacerations: 1st degree perineal Suture Repair: none Est. Blood Loss (mL):  50cc Mom to postpartum.  Baby to Couplet care / Skin to Skin.  Jodi DinningChristina M Murray 05/04/2016, 12:25 AM  Patient is a [id number] at 7148w5d who was admitted in SOL, uncomplicated prenatal course.  She progressed without augmentation to SVD.  I was gloved and present for delivery in its entirety.  Second stage of labor progressed, baby delivered after a few contractions.  No decels during second stage noted.  Complications: given cytotec po for some trickling in the first hour of delivery  Lacerations: none  EBL: 50cc  Jodi Murray, CNM 8:58 AM

## 2016-05-01 ENCOUNTER — Ambulatory Visit (INDEPENDENT_AMBULATORY_CARE_PROVIDER_SITE_OTHER): Payer: Self-pay | Admitting: Family

## 2016-05-01 VITALS — BP 114/68 | HR 80 | Wt 162.7 lb

## 2016-05-01 DIAGNOSIS — Z3483 Encounter for supervision of other normal pregnancy, third trimester: Secondary | ICD-10-CM

## 2016-05-01 DIAGNOSIS — Z3480 Encounter for supervision of other normal pregnancy, unspecified trimester: Secondary | ICD-10-CM

## 2016-05-01 NOTE — Progress Notes (Signed)
   PRENATAL VISIT NOTE  Subjective:  Jodi Murray is a 31 y.o. G3P2002 at 4024w2d being seen today for ongoing prenatal care.  She is currently monitored for the following issues for this low-risk pregnancy and has Supervision of normal pregnancy, antepartum; Short interval between pregnancies affecting pregnancy, antepartum; and Language barrier on her problem list.  Patient reports no complaints.  Contractions: Not present. Vag. Bleeding: None.  Movement: Present. Denies leaking of fluid.   The following portions of the patient's history were reviewed and updated as appropriate: allergies, current medications, past family history, past medical history, past social history, past surgical history and problem list. Problem list updated.  Objective:   Vitals:   05/01/16 0856  BP: 114/68  Pulse: 80  Weight: 162 lb 11.2 oz (73.8 kg)    Fetal Status: Fetal Heart Rate (bpm): 138 Fundal Height: 39 cm Movement: Present  Presentation: Vertex  General:  Alert, oriented and cooperative. Patient is in no acute distress.  Skin: Skin is warm and dry. No rash noted.   Cardiovascular: Normal heart rate noted  Respiratory: Normal respiratory effort, no problems with respiration noted  Abdomen: Soft, gravid, appropriate for gestational age. Pain/Pressure: Present     Pelvic:  Cervical exam deferred        Extremities: Normal range of motion.  Edema: Trace  Mental Status: Normal mood and affect. Normal behavior. Normal judgment and thought content.   Assessment and Plan:  Pregnancy: G3P2002 at 8924w2d  1. Supervision of other normal pregnancy, antepartum - Reviewed GBS results - Discussed NST at next appt; IOL at 41 wks  Term labor symptoms and general obstetric precautions including but not limited to vaginal bleeding, contractions, leaking of fluid and fetal movement were reviewed in detail with the patient. Please refer to After Visit Summary for other counseling recommendations.    Return in about 1 week (around 05/08/2016) for appt and NST.   Eino FarberWalidah Kennith GainN Karim, CNM

## 2016-05-03 ENCOUNTER — Inpatient Hospital Stay (HOSPITAL_COMMUNITY)
Admission: AD | Admit: 2016-05-03 | Discharge: 2016-05-05 | DRG: 775 | Disposition: A | Discharge status: Home / Self Care | Payer: Medicaid Other | Source: Ambulatory Visit | Attending: Obstetrics and Gynecology | Admitting: Obstetrics and Gynecology

## 2016-05-03 ENCOUNTER — Encounter (HOSPITAL_COMMUNITY): Payer: Self-pay | Admitting: General Practice

## 2016-05-03 DIAGNOSIS — Z8249 Family history of ischemic heart disease and other diseases of the circulatory system: Secondary | ICD-10-CM | POA: Diagnosis not present

## 2016-05-03 DIAGNOSIS — Z3A39 39 weeks gestation of pregnancy: Secondary | ICD-10-CM

## 2016-05-03 DIAGNOSIS — Z3493 Encounter for supervision of normal pregnancy, unspecified, third trimester: Secondary | ICD-10-CM | POA: Diagnosis present

## 2016-05-03 LAB — CBC
HEMATOCRIT: 33.8 % — AB (ref 36.0–46.0)
HEMOGLOBIN: 11.1 g/dL — AB (ref 12.0–15.0)
MCH: 27.2 pg (ref 26.0–34.0)
MCHC: 32.8 g/dL (ref 30.0–36.0)
MCV: 82.8 fL (ref 78.0–100.0)
Platelets: 234 10*3/uL (ref 150–400)
RBC: 4.08 MIL/uL (ref 3.87–5.11)
RDW: 15 % (ref 11.5–15.5)
WBC: 13 10*3/uL — ABNORMAL HIGH (ref 4.0–10.5)

## 2016-05-03 MED ORDER — FENTANYL CITRATE (PF) 100 MCG/2ML IJ SOLN
INTRAMUSCULAR | Status: AC
  Filled 2016-05-03: qty 2

## 2016-05-03 MED ORDER — OXYCODONE-ACETAMINOPHEN 5-325 MG PO TABS: 1.0000 | ORAL_TABLET | ORAL | Status: DC | PRN

## 2016-05-03 MED ORDER — EPHEDRINE 5 MG/ML INJ
10.0000 mg | INTRAVENOUS | Status: DC | PRN
  Filled 2016-05-03: qty 4

## 2016-05-03 MED ORDER — FENTANYL CITRATE (PF) 100 MCG/2ML IJ SOLN
100.0000 ug | INTRAMUSCULAR | Status: DC | PRN
  Administered 2016-05-03: 100 ug via INTRAVENOUS

## 2016-05-03 MED ORDER — OXYCODONE-ACETAMINOPHEN 5-325 MG PO TABS: 2.0000 | ORAL_TABLET | ORAL | Status: DC | PRN

## 2016-05-03 MED ORDER — LACTATED RINGERS IV SOLN: 500.0000 mL | INTRAVENOUS | Status: DC | PRN

## 2016-05-03 MED ORDER — SOD CITRATE-CITRIC ACID 500-334 MG/5ML PO SOLN: 30.0000 mL | ORAL | Status: DC | PRN

## 2016-05-03 MED ORDER — FENTANYL 2.5 MCG/ML BUPIVACAINE 1/10 % EPIDURAL INFUSION (WH - ANES): 14.0000 mL/h | INTRAMUSCULAR | Status: DC | PRN

## 2016-05-03 MED ORDER — ONDANSETRON HCL 4 MG/2ML IJ SOLN: 4.0000 mg | Freq: Four times a day (QID) | INTRAMUSCULAR | Status: DC | PRN

## 2016-05-03 MED ORDER — ACETAMINOPHEN 325 MG PO TABS
650.0000 mg | ORAL_TABLET | ORAL | Status: DC | PRN
  Administered 2016-05-04: 650 mg via ORAL
  Filled 2016-05-03: qty 2

## 2016-05-03 MED ORDER — PHENYLEPHRINE 40 MCG/ML (10ML) SYRINGE FOR IV PUSH (FOR BLOOD PRESSURE SUPPORT)
80.0000 ug | PREFILLED_SYRINGE | INTRAVENOUS | Status: DC | PRN
  Filled 2016-05-03: qty 5

## 2016-05-03 MED ORDER — OXYTOCIN BOLUS FROM INFUSION
500.0000 mL | Freq: Once | INTRAVENOUS | Status: AC
  Administered 2016-05-04: 500 mL via INTRAVENOUS

## 2016-05-03 MED ORDER — OXYTOCIN 40 UNITS IN LACTATED RINGERS INFUSION - SIMPLE MED
2.5000 [IU]/h | INTRAVENOUS | Status: DC
  Administered 2016-05-04: 2.5 [IU]/h via INTRAVENOUS
  Filled 2016-05-03: qty 1000

## 2016-05-03 MED ORDER — LACTATED RINGERS IV SOLN: 500.0000 mL | Freq: Once | INTRAVENOUS | Status: DC

## 2016-05-03 MED ORDER — LIDOCAINE HCL (PF) 1 % IJ SOLN
30.0000 mL | INTRAMUSCULAR | Status: DC | PRN
  Filled 2016-05-03: qty 30

## 2016-05-03 MED ORDER — DIPHENHYDRAMINE HCL 50 MG/ML IJ SOLN: 12.5000 mg | INTRAMUSCULAR | Status: DC | PRN

## 2016-05-03 MED ORDER — LACTATED RINGERS IV SOLN
INTRAVENOUS | Status: DC
  Administered 2016-05-03: 23:00:00 via INTRAVENOUS

## 2016-05-03 NOTE — MAU Note (Signed)
Ctxs since 0700. Stronger and closer. Denies LOF but some bloody show. Cervix closed last sve

## 2016-05-03 NOTE — H&P (Signed)
Jodi Murray is a 31 y.o. female G3P2002 @ 39.4wks presenting for SOL.  OB History    Gravida Para Term Preterm AB Living   3 2 2     2    SAB TAB Ectopic Multiple Live Births         0 2     Past Medical History:  Diagnosis Date  . Breast lump on right side at 1 o'clock position 05/18/2012   [facility]. Appointment scheduled for Friday, May 21, 2012 at 0930. Negative w/u. Routine screening at age 31 and BSE prior.   . Medical history non-contributory    Past Surgical History:  Procedure Laterality Date  . NO PAST SURGERIES     Family History: family history includes Hypertension in her mother. Social History:  reports that she has never smoked. She has never used smokeless tobacco. She reports that she does not drink alcohol or use drugs.     Maternal Diabetes: No Genetic Screening: Declined Maternal Ultrasounds/Referrals: Normal Fetal Ultrasounds or other Referrals:  None Maternal Substance Abuse:  No Significant Maternal Medications:  None Significant Maternal Lab Results:  Lab values include: Group B Strep negative Other Comments:  None  Review of Systems  Constitutional: Negative for fever.  HENT: Negative for congestion.   Eyes: Negative for blurred vision.  Respiratory: Negative for cough, hemoptysis, sputum production, shortness of breath and wheezing.   Cardiovascular: Negative for chest pain.  Gastrointestinal: Negative for abdominal pain, constipation, diarrhea, heartburn, nausea and vomiting.  Genitourinary: Negative for dysuria.  Musculoskeletal: Negative for myalgias.  Skin: Negative for rash.  Neurological: Negative for dizziness, tremors and headaches.  Psychiatric/Behavioral: Negative for depression.   Maternal Medical History:  Reason for admission: Nausea.    Dilation: 5.5 Effacement (%): 100 Station: -1 Exam by:: A. Schwarz RN  Blood pressure 119/69, pulse 77,  temperature 97.8 F (36.6 C), temperature source Oral, resp. rate 20, height 5\' 2"  (1.575 m), weight 73 kg (161 lb), last menstrual period 08/11/2015, unknown if currently breastfeeding. Exam Physical Exam  Constitutional: She is oriented to person, place, and time. She appears well-developed and well-nourished.  HENT:  Head: Normocephalic.  Mouth/Throat: Oropharynx is clear and moist.  Eyes: Conjunctivae and EOM are normal. Pupils are equal, round, and reactive to light.  Neck: Normal range of motion. Neck supple.  Cardiovascular: Normal rate.   Respiratory: Effort normal and breath sounds normal.  GI: Soft. She exhibits no distension.  Musculoskeletal: Normal range of motion. She exhibits no edema.  Neurological: She is alert and oriented to person, place, and time.  Skin: Skin is warm. No erythema.  Psychiatric: She has a normal mood and affect. Her behavior is normal.    Prenatal labs: ABO, Rh: A/POS/-- (07/31 1029) Antibody: NEG (07/31 1029) Rubella: 25.60 (07/31 1029) RPR: NON REAC (10/25 0927)  HBsAg: NEGATIVE (07/31 1029)  HIV: NONREACTIVE (10/25 0927)  GBS: Negative (12/14 0000)   Assessment/Plan: Silverio Decampna Jodi Murray is  31 y.o. G3P2002 at 6442w4d with an IUP at 8942w4d presenting for SOL.  Plan: #Labor: Expectant management #Pain: IV pain meds #FWB: Cat 1 #ID: GBS: negative #MOF: breast and bottle  #MOC: IUD #Circ: no  Beaulah Dinninghristina M Gambino, MD PGY-2 05/03/2016, 11:37 PM   CNM attestation:  I have seen and examined this patient; I agree with above documentation in the resident's note.   Mikayah Javier DockerMaria Murray is a 31 y.o. [id number] here for SOL  PE:  BP 102/67   Pulse 82   Temp 97.5 F (36.4 C) (Axillary)   Resp 18   Ht 5\' 2"  (1.575 m)   Wt 73 kg (161 lb)   LMP 08/11/2015 (Exact Date)   BMI 29.45 kg/m   Resp: normal effort, no distress Abd: gravid  ROS, labs, PMH reviewed  Plan: Admit to [facility] Expectant management Anticipate  SVD  Cam Hai CNM 05/04/2016, 12:53 AM

## 2016-05-04 ENCOUNTER — Encounter (HOSPITAL_COMMUNITY): Payer: Self-pay | Admitting: General Practice

## 2016-05-04 LAB — TYPE AND SCREEN
ABO/RH(D): A POS
ANTIBODY SCREEN: NEGATIVE

## 2016-05-04 LAB — RPR: RPR: NONREACTIVE

## 2016-05-04 MED ORDER — ACETAMINOPHEN 325 MG PO TABS: 650.0000 mg | ORAL_TABLET | ORAL | Status: DC | PRN

## 2016-05-04 MED ORDER — SODIUM CHLORIDE 0.9 % IV SOLN
INTRAVENOUS | Status: DC
  Administered 2016-05-04: 02:00:00 via INTRAVENOUS

## 2016-05-04 MED ORDER — DIPHENHYDRAMINE HCL 25 MG PO CAPS: 25.0000 mg | ORAL_CAPSULE | Freq: Four times a day (QID) | ORAL | Status: DC | PRN

## 2016-05-04 MED ORDER — ONDANSETRON HCL 4 MG/2ML IJ SOLN: 4.0000 mg | INTRAMUSCULAR | Status: DC | PRN

## 2016-05-04 MED ORDER — SENNOSIDES-DOCUSATE SODIUM 8.6-50 MG PO TABS
2.0000 | ORAL_TABLET | ORAL | Status: DC
  Administered 2016-05-04: 2 via ORAL
  Filled 2016-05-04: qty 2

## 2016-05-04 MED ORDER — WITCH HAZEL-GLYCERIN EX PADS: 1.0000 "application " | MEDICATED_PAD | CUTANEOUS | Status: DC | PRN

## 2016-05-04 MED ORDER — PRENATAL MULTIVITAMIN CH
1.0000 | ORAL_TABLET | Freq: Every day | ORAL | Status: DC
  Administered 2016-05-04 – 2016-05-05 (×2): 1 via ORAL
  Filled 2016-05-04 (×2): qty 1

## 2016-05-04 MED ORDER — TETANUS-DIPHTH-ACELL PERTUSSIS 5-2.5-18.5 LF-MCG/0.5 IM SUSP: 0.5000 mL | Freq: Once | INTRAMUSCULAR | Status: DC

## 2016-05-04 MED ORDER — SIMETHICONE 80 MG PO CHEW: 80.0000 mg | CHEWABLE_TABLET | ORAL | Status: DC | PRN

## 2016-05-04 MED ORDER — MISOPROSTOL 200 MCG PO TABS
ORAL_TABLET | ORAL | Status: AC
  Filled 2016-05-04: qty 4

## 2016-05-04 MED ORDER — LORATADINE 10 MG PO TABS
10.0000 mg | ORAL_TABLET | Freq: Every day | ORAL | Status: DC
Start: 2016-05-04 — End: 2016-05-05
  Administered 2016-05-04 – 2016-05-05 (×2): 10 mg via ORAL
  Filled 2016-05-04 (×2): qty 1

## 2016-05-04 MED ORDER — DIBUCAINE 1 % RE OINT: 1.0000 "application " | TOPICAL_OINTMENT | RECTAL | Status: DC | PRN

## 2016-05-04 MED ORDER — MISOPROSTOL 200 MCG PO TABS
800.0000 ug | ORAL_TABLET | Freq: Once | ORAL | Status: AC
  Administered 2016-05-04: 800 ug via ORAL

## 2016-05-04 MED ORDER — ONDANSETRON HCL 4 MG PO TABS: 4.0000 mg | ORAL_TABLET | ORAL | Status: DC | PRN

## 2016-05-04 MED ORDER — ZOLPIDEM TARTRATE 5 MG PO TABS: 5.0000 mg | ORAL_TABLET | Freq: Every evening | ORAL | Status: DC | PRN

## 2016-05-04 MED ORDER — COCONUT OIL OIL: 1.0000 "application " | TOPICAL_OIL | Status: DC | PRN

## 2016-05-04 MED ORDER — BENZOCAINE-MENTHOL 20-0.5 % EX AERO
1.0000 "application " | INHALATION_SPRAY | CUTANEOUS | Status: DC | PRN
  Administered 2016-05-04: 1 via TOPICAL
  Filled 2016-05-04: qty 56

## 2016-05-04 MED ORDER — IBUPROFEN 600 MG PO TABS
600.0000 mg | ORAL_TABLET | Freq: Four times a day (QID) | ORAL | Status: DC
  Administered 2016-05-04 – 2016-05-05 (×6): 600 mg via ORAL
  Filled 2016-05-04 (×6): qty 1

## 2016-05-04 NOTE — Plan of Care (Signed)
Problem: Education: Goal: Knowledge of condition will improve Jodi Murray, in house interpreter, used for shift change report, education and interpretation. Discussed with patient the importance of keeping bladder empty. Discussed pain medication and keeping track of breast feedings and diaper changes. Also encouraged ambulation today. Answered questions about bathing baby and caring for baby as well.

## 2016-05-04 NOTE — Progress Notes (Signed)
Patient complaining of itchy, watery eyes, sneezing, running nose. Patient states she only began noticing symptoms once admitted to hospital. Notified Zerita Boersarlene Lawson, CNM. Darlene ordered zyrtec 10 mg daily. Formulary equivalent in hospital per pharmacy is claritin which was ordered for patient. Will continue to monitor. Earl Galasborne, Linda HedgesStefanie GileadHudspeth

## 2016-05-04 NOTE — Lactation Note (Signed)
This note was copied from a baby's chart. Lactation Consultation Note  Patient Name: Boy Evelina Bucyna Vazquez-Garcia ZOXWR'UToday's Date: 05/04/2016 Reason for consult: Initial assessment;Other (Comment) (spanish interpreter present - Marta Col , LC encouraged mom to call for feeding assessment - Latch score )  Baby is 15 hours old and has been to the breast consistently in his life, voids and stools QS for age, and already having yellow seedy stools. Latch score range 8-9's.  Per mom active with WIC - [place]. Baby being held by family member and baby asleep.  LC discussed the importance of having either and RN or LC check latch every shift and encouraged mom to call on the nurses light and mom receptive.  Mother informed of post-discharge support and given phone number to the lactation department, including services for phone call assistance; out-patient appointments; and breastfeeding support group. List of other breastfeeding resources in the community given in the handout. Encouraged mother to call for problems or concerns related to breastfeeding.   Maternal Data Does the patient have breastfeeding experience prior to this delivery?: Yes  Feeding Feeding Type:  (baby sleeping - recently breast fed per mom - at 1500 for 20 mins ) Length of feed: 20 min  LATCH Score/Interventions                Intervention(s): Breastfeeding basics reviewed     Lactation Tools Discussed/Used WIC Program: [facility] [place] [facility]   Consult Status Consult Status: Follow-up Date: 05/04/16 Follow-up type: In-patient    Matilde SprangMargaret Ann Ranee Peasley 05/04/2016, 5:08 PM

## 2016-05-05 MED ORDER — DIPHENHYDRAMINE HCL 25 MG PO CAPS: 25.0000 mg | ORAL_CAPSULE | Freq: Four times a day (QID) | ORAL | 0 refills | Status: DC | PRN

## 2016-05-05 MED ORDER — PRENATAL MULTIVITAMIN CH: 1.0000 | ORAL_TABLET | Freq: Every day | ORAL | 11 refills | Status: DC

## 2016-05-05 NOTE — Discharge Summary (Signed)
OB Discharge Summary  Patient Name: Jodi Murray DOB: 23-Apr-1986 MRN: [id number]  Date of admission: 05/03/2016 Delivering MD: Anders SimmondsGAMBINO, CHRISTINA M   Date of discharge: 05/05/2016  Admitting diagnosis: 39 WKS, CTXS Intrauterine pregnancy: 7644w5d     Secondary diagnosis:Active Problems:   Labor and delivery indication for care or intervention  Additional problems:none     Discharge diagnosis: Term Pregnancy Delivered                                                                     Post partum procedures:n/a  Augmentation: n/a  Complications: None  Hospital course:  Onset of Labor With Vaginal Delivery     31 y.o. yo G3P3003 at 2644w5d was admitted in Active Labor on 05/03/2016. Patient had an uncomplicated labor course as follows:  Membrane Rupture Time/Date: 11:58 PM ,05/03/2016   Intrapartum Procedures: Episiotomy: None [1]                                         Lacerations:  1st degree [2]  Patient had a delivery of a Viable infant. 05/04/2016  Information for the patient's newborn:  Jodi Murray, Jodi Murray [[id number]][[id number]]  Delivery Method: Vaginal, Spontaneous Delivery (Filed from Delivery Summary)    Pateint had an uncomplicated postpartum course.  She is ambulating, tolerating a regular diet, passing flatus, and urinating well. Patient is discharged home in stable condition on 05/05/16.    Physical exam Vitals:   05/04/16 0730 05/04/16 1012 05/04/16 1821 05/05/16 0000  BP: 107/67 (!) 98/54 108/65   Pulse: 67 69 64   Resp: 18 18 18 18   Temp: 99.6 F (37.6 C) 98.4 F (36.9 C) 97.7 F (36.5 C)   TempSrc: Oral Oral Oral   Weight:      Height:       General: alert, cooperative and no distress Lochia: appropriate Uterine Fundus: firm Incision: N/A DVT Evaluation: No evidence of DVT seen on physical exam. Labs: Lab Results  Component Value Date   WBC 13.0 (H) 05/03/2016   HGB 11.1 (L) 05/03/2016   HCT 33.8 (L) 05/03/2016   MCV 82.8 05/03/2016   PLT 234 05/03/2016   No flowsheet data found.  Discharge instruction: per After Visit Summary and "Baby and Me Booklet".  After Visit Meds:  Allergies as of 05/05/2016   No Known Allergies     Medication List    STOP taking these medications   acetaminophen 325 MG tablet Commonly known as:  TYLENOL     TAKE these medications   diphenhydrAMINE 25 mg capsule Commonly known as:  BENADRYL Take 1 capsule (25 mg total) by mouth every 6 (six) hours as needed for itching.   prenatal multivitamin Tabs tablet Take 1 tablet by mouth daily at 12 noon.       Diet: routine diet  Activity: Advance as tolerated. Pelvic rest for 6 weeks.   Outpatient follow up:6 weeks Follow up Appt:Future Appointments Date Time Provider Department Center  05/08/2016 1:40 PM Hurshel PartyLisa A Leftwich-Kirby, CNM [facility]   Follow up visit: No Follow-up on file.  Postpartum contraception: IUD liletta  Newborn Data:  Live born female  Birth Weight: 7 lb 3.2 oz (3265 g) APGAR: 8, 9  Baby Feeding: Bottle and Breast Disposition:home with mother   05/05/2016 Jodi Murray, CNM

## 2016-05-05 NOTE — Lactation Note (Signed)
This note was copied from a baby's chart. Lactation Consultation Note  Patient Name: Jodi Murray WUJWJ'XToday's Date: 05/05/2016 Reason for consult: Follow-up assessment  Baby 35 hours old. Used Spanish interpreter "Christiane HaJonathan" [phone]). Mom reports that she intends to give formula as well as BF this baby as she did before. Discussed supply and demand and enc putting baby to breast first and giving formula afterwards as needed. Mom latched baby to right breast in a cradle position. Assisted mom with pillows for support and turned baby's body to mom, chest-to-chest. Demonstrated to mom how to support baby's head in a cross-cradle position and baby achieved and maintained a deeper latch. Mom reports no questions at this time, and she is aware of OP/BFSG and LC phone line assistance after D/C.   Maternal Data Has patient been taught Hand Expression?: Yes  Feeding Feeding Type: Breast Fed Length of feed: 10 min  LATCH Score/Interventions Latch: Grasps breast easily, tongue down, lips flanged, rhythmical sucking.  Audible Swallowing: Spontaneous and intermittent  Type of Nipple: Everted at rest and after stimulation  Comfort (Breast/Nipple): Soft / non-tender     Hold (Positioning): Assistance needed to correctly position infant at breast and maintain latch. Intervention(s): Support Pillows  LATCH Score: 9  Lactation Tools Discussed/Used     Consult Status Consult Status: PRN    Sherlyn HayJennifer D Nonie Lochner 05/05/2016, 11:14 AM

## 2016-05-05 NOTE — Progress Notes (Signed)
UR chart review completed.  

## 2016-05-08 ENCOUNTER — Other Ambulatory Visit: Payer: Self-pay | Admitting: Advanced Practice Midwife

## 2016-06-12 ENCOUNTER — Ambulatory Visit: Payer: Self-pay | Admitting: Student

## 2017-04-01 IMAGING — US US MFM OB COMP +14 WKS
1 series · 14 of 28 positions shown · non-contrast
Comparison: none

[Series 1: us mfm ob comp +14 wks · 90 acquisitions, 14 frames shown]
[im 4/90]
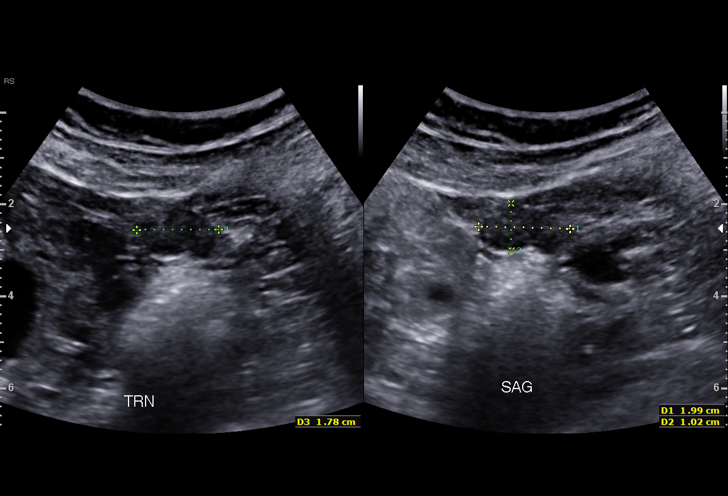
[im 10/90]
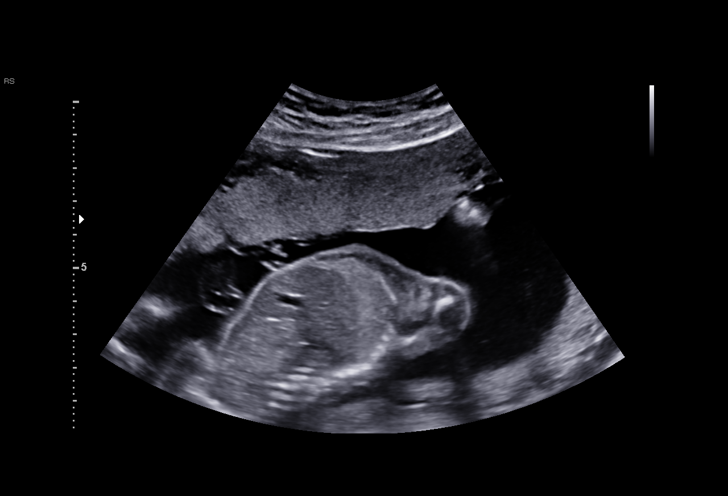
[im 17/90]
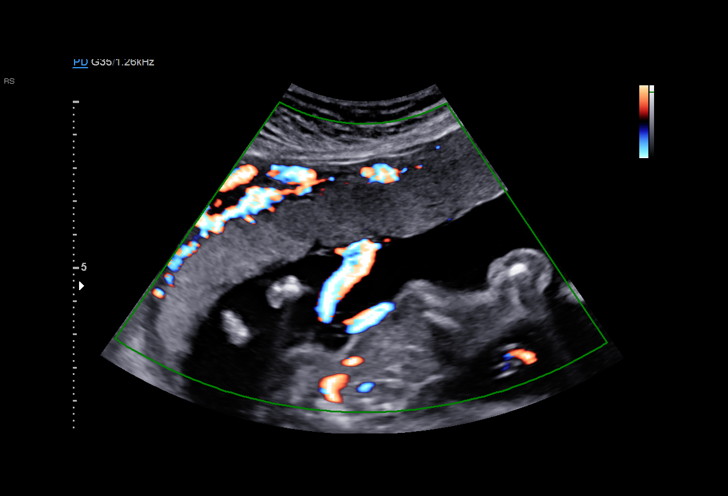
[im 24/90]
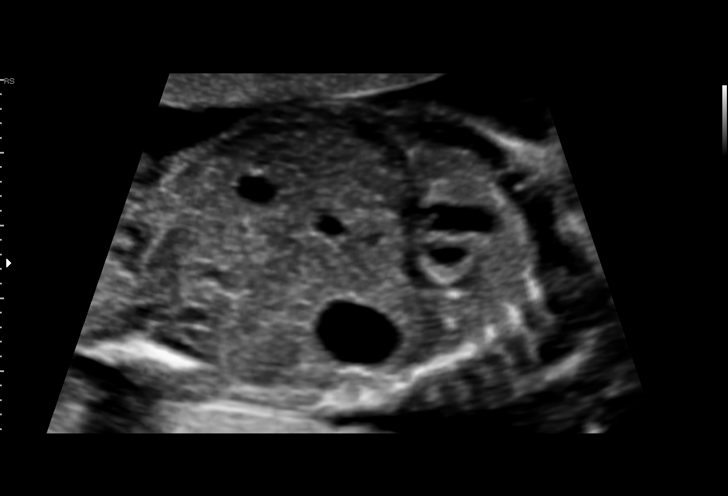
[im 30/90]
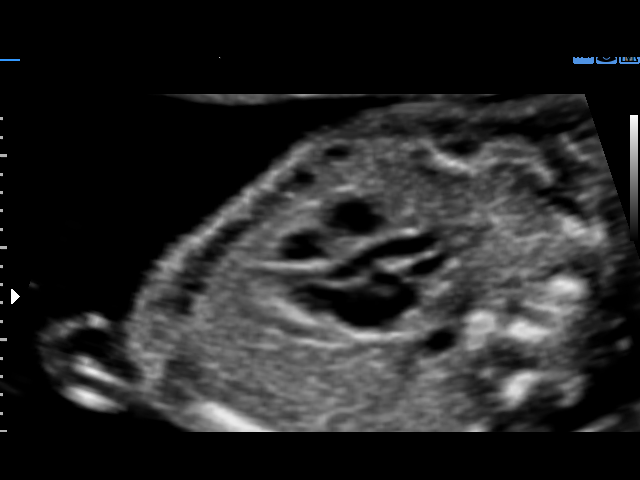
[im 37/90]
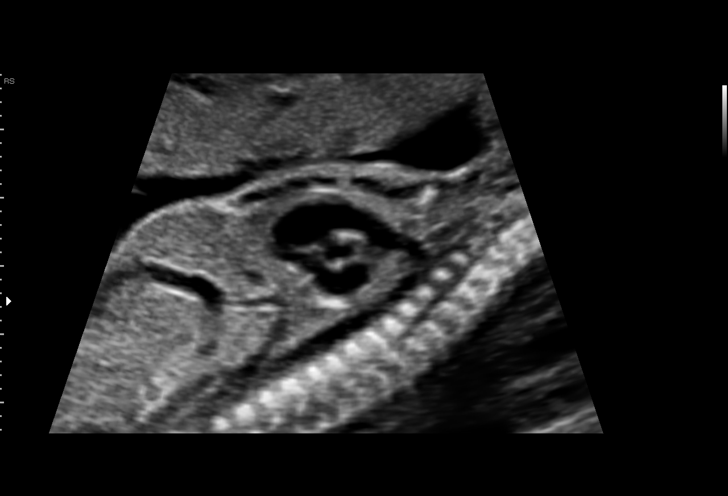
[im 43/90]
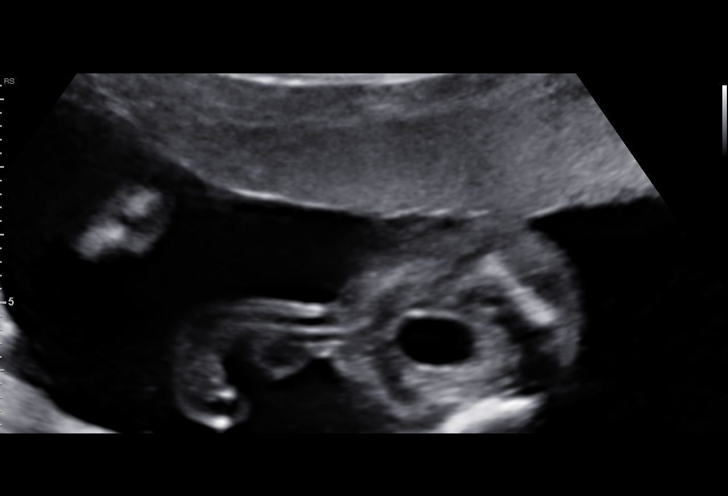
[im 50/90]
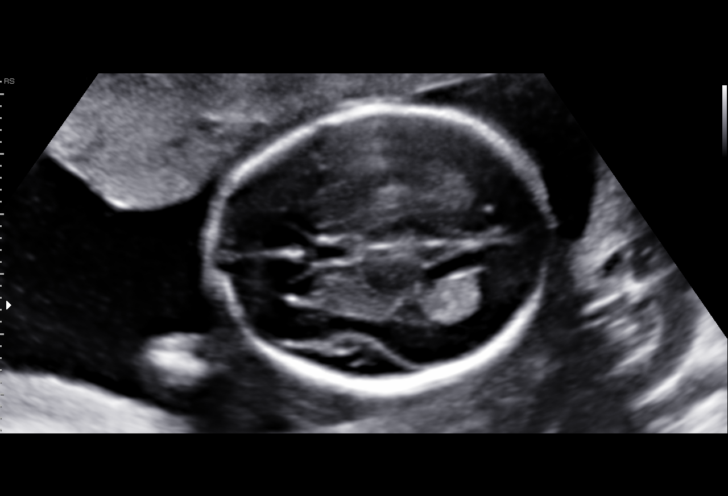
[im 57/90]
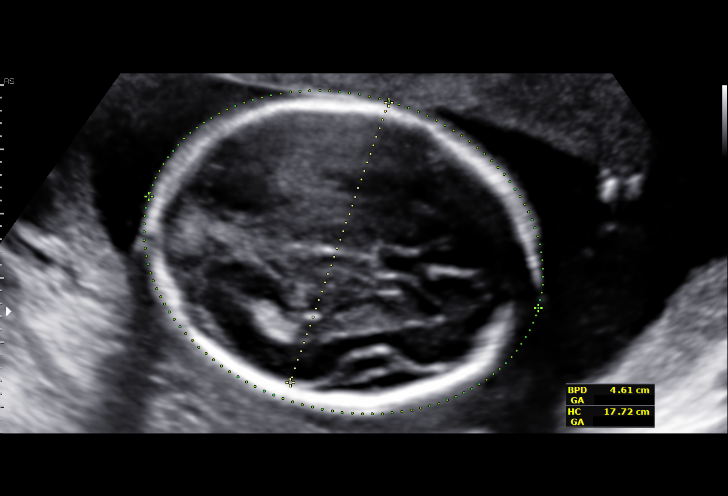
[im 63/90]
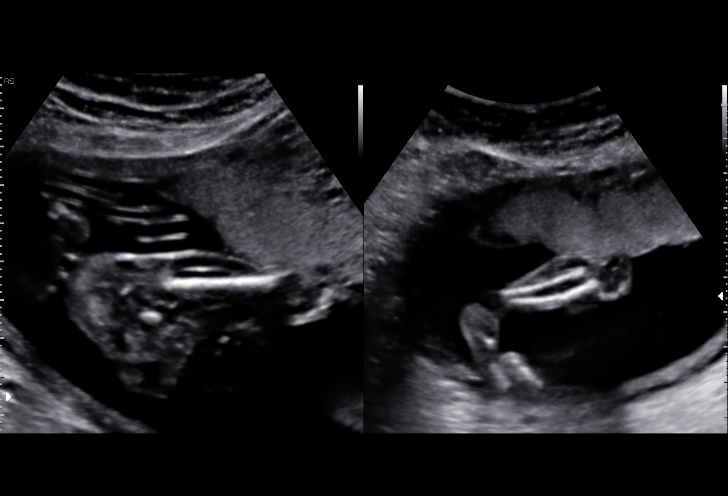
[im 70/90]
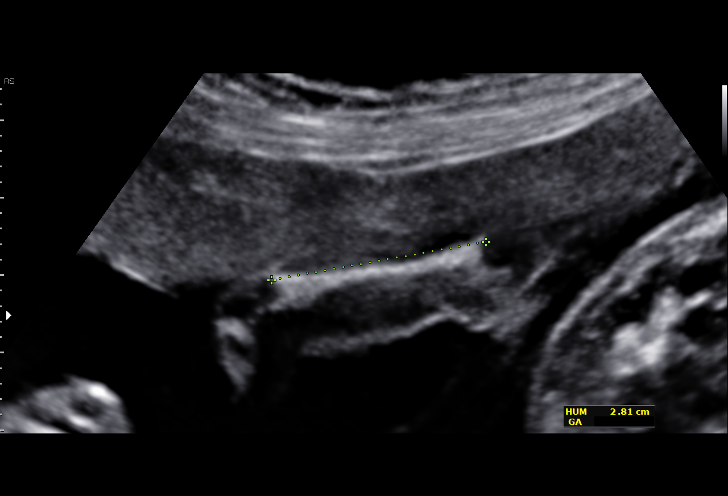
[im 76/90]
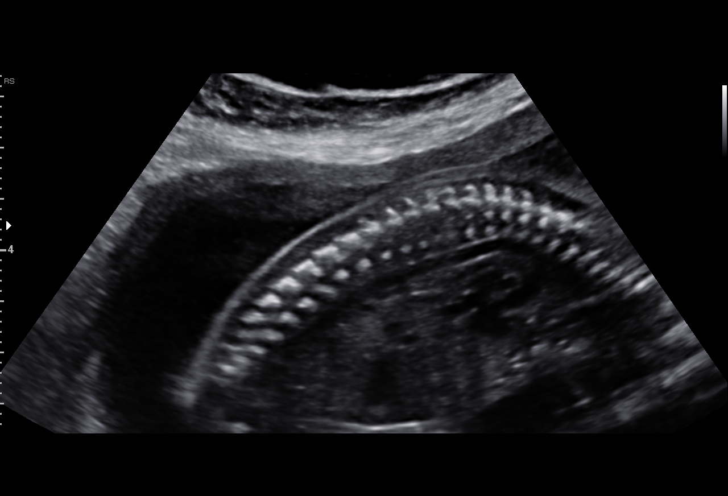
[im 83/90]
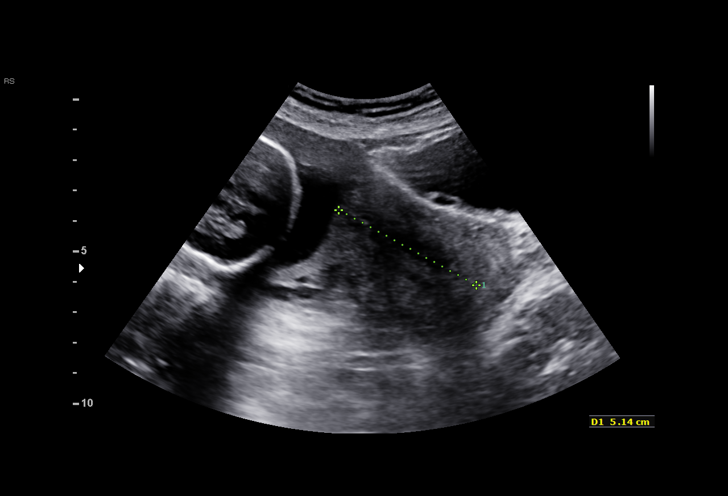
[im 90/90]
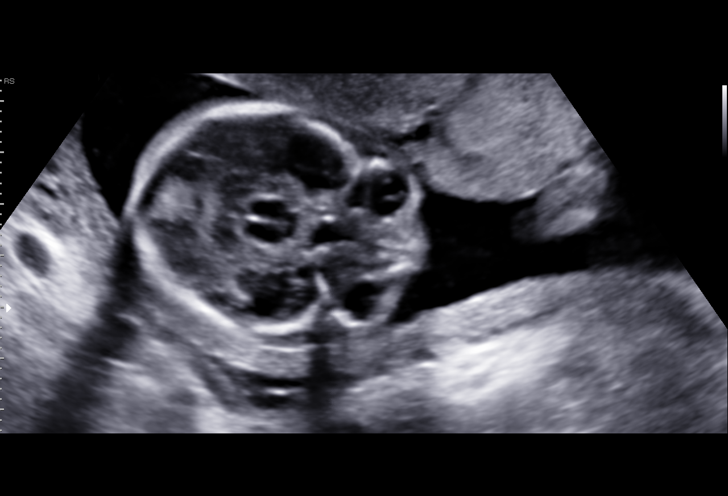

[14 of 28 positions shown; findings below may reference images not displayed]

WAMPLER

OB/Gyn Clinic
[REDACTED]

1  JOAOLUIS OZAKI          [id number]      [id number]     [id number]
Indications

20 weeks gestation of pregnancy
Basic anatomic survey                          Z36
OB History

Gravidity:    3         Term:   2
Living:       2
Fetal Evaluation

Num Of Fetuses:     1
Fetal Heart         152
Rate(bpm):
Cardiac Activity:   Observed
Presentation:       Cephalic
Placenta:           Anterior, above cervical os
P. Cord Insertion:  Visualized, central

Amniotic Fluid
AFI FV:      Subjectively within normal limits

Largest Pocket(cm)
4.58
Biometry

BPD:      45.7  mm     G. Age:  19w 6d         42  %    CI:        68.43   %   70 - 86
FL/HC:      17.5   %   16.8 -
HC:      176.6  mm     G. Age:  20w 1d         49  %    HC/AC:      1.15       1.09 -
AC:      153.1  mm     G. Age:  20w 4d         61  %    FL/BPD:     67.6   %
FL:       30.9  mm     G. Age:  19w 4d         28  %    FL/AC:      20.2   %   20 - 24
HUM:      28.5  mm     G. Age:  19w 1d         33  %
CER:      21.3  mm     G. Age:  20w 2d         55  %

Est. FW:     331  gm    0 lb 12 oz      51  %
Gestational Age

LMP:           18w 3d       Date:   08/11/15                 EDD:   05/17/16
U/S Today:     20w 0d                                        EDD:   05/06/16
Best:          20w 0d    Det. By:   U/S (12/18/15)           EDD:   05/06/16
Anatomy

Cranium:               Appears normal         Aortic Arch:            Appears normal
Cavum:                 Appears normal         Ductal Arch:            Appears normal
Ventricles:            Appears normal         Diaphragm:              Appears normal
Choroid Plexus:        Appears normal         Stomach:                Appears normal, left
sided
Cerebellum:            Appears normal         Abdomen:                Appears normal
Posterior Fossa:       Appears normal         Abdominal Wall:         Appears nml (cord
insert, abd wall)
Nuchal Fold:           Appears normal         Cord Vessels:           Appears normal (3
vessel cord)
Face:                  Appears normal         Kidneys:                Appear normal
(orbits and profile)
Lips:                  Not well visualized    Bladder:                Appears normal
Thoracic:              Appears normal         Spine:                  Appears normal
Heart:                 Appears normal         Upper Extremities:      Appears normal
(4CH, axis, and
situs)
RVOT:                  Appears normal         Lower Extremities:      Appears normal
LVOT:                  Appears normal

Other:  Fetus appears to be a male. Heels visualized. Technically difficult due
to fetal position.
Cervix Uterus Adnexa

Cervix
Length:              5  cm.
Normal appearance by transabdominal scan.

Uterus
No abnormality visualized.

Left Ovary
Within normal limits.

Right Ovary
Not visualized.

Adnexa:       No abnormality visualized. No adnexal mass
visualized.
Impression

SIUP at 20+0 weeks
Normal detailed fetal anatomy; limited views of upper lip
Markers of aneuploidy: none
Normal amniotic fluid volume
EDC based on today's measurements: 05/06/16
Recommendations

Follow-up as clinically indicated

## 2017-04-29 ENCOUNTER — Encounter (HOSPITAL_COMMUNITY): Payer: Self-pay | Admitting: Emergency Medicine

## 2017-04-29 ENCOUNTER — Other Ambulatory Visit: Payer: Self-pay

## 2017-04-29 ENCOUNTER — Emergency Department (HOSPITAL_COMMUNITY)
Admission: EM | Admit: 2017-04-29 | Discharge: 2017-04-29 | Disposition: A | Discharge status: Home / Self Care | Payer: Self-pay | Attending: Emergency Medicine | Admitting: Emergency Medicine

## 2017-04-29 DIAGNOSIS — X58XXXA Exposure to other specified factors, initial encounter: Secondary | ICD-10-CM | POA: Insufficient documentation

## 2017-04-29 DIAGNOSIS — S0502XA Injury of conjunctiva and corneal abrasion without foreign body, left eye, initial encounter: Secondary | ICD-10-CM | POA: Insufficient documentation

## 2017-04-29 DIAGNOSIS — Y999 Unspecified external cause status: Secondary | ICD-10-CM | POA: Insufficient documentation

## 2017-04-29 DIAGNOSIS — Y929 Unspecified place or not applicable: Secondary | ICD-10-CM | POA: Insufficient documentation

## 2017-04-29 DIAGNOSIS — Z79899 Other long term (current) drug therapy: Secondary | ICD-10-CM | POA: Insufficient documentation

## 2017-04-29 DIAGNOSIS — Y939 Activity, unspecified: Secondary | ICD-10-CM | POA: Insufficient documentation

## 2017-04-29 MED ORDER — TETRACAINE HCL 0.5 % OP SOLN
2.0000 [drp] | Freq: Once | OPHTHALMIC | Status: AC
  Administered 2017-04-29: 2 [drp] via OPHTHALMIC
  Filled 2017-04-29: qty 4

## 2017-04-29 MED ORDER — FLUORESCEIN SODIUM 1 MG OP STRP
1.0000 | ORAL_STRIP | Freq: Once | OPHTHALMIC | Status: AC
  Administered 2017-04-29: 1 via OPHTHALMIC
  Filled 2017-04-29: qty 1

## 2017-04-29 MED ORDER — ERYTHROMYCIN 5 MG/GM OP OINT: TOPICAL_OINTMENT | OPHTHALMIC | 0 refills | Status: DC

## 2017-04-29 NOTE — ED Provider Notes (Signed)
[facility]   CSN: [id number] Arrival date & time: 04/29/17  1701     History   Chief Complaint Chief Complaint  Patient presents with  . Eye Problem    HPI Jodi Murray is a 32 y.o. female who presents to the emergency department with complaint of L eye foreign body sensation that has been progressively worsening x 2 weeks. Patient states she does not recall injury or getting anything stuck in the eye. She states that she is having discomfort in the eye that is a 7/10 in severity, worse with eye movements and light. Also reports pruritus and blurry vision to the L eye. Denies discharge to the eye, eye swelling, fever, chills, congestion, rhinorrhea, or ear pain. Patient is not a contact lens wearer. Last tetanus was 1 year prior. Translator used throughout Audiological scientistencounter.      Past Medical History:  Diagnosis Date  . Breast lump on right side at 1 o'clock position 05/18/2012   [facility]. Appointment scheduled for Friday, May 21, 2012 at 0930. Negative w/u. Routine screening at age 32 and BSE prior.   . Medical history non-contributory     Patient Active Problem List   Diagnosis Date Noted  . Labor and delivery indication for care or intervention 05/03/2016  . Supervision of normal pregnancy, antepartum 11/26/2015  . Short interval between pregnancies affecting pregnancy, antepartum 11/26/2015  . Language barrier 11/26/2015    Past Surgical History:  Procedure Laterality Date  . NO PAST SURGERIES      OB History    Gravida Para Term Preterm AB Living   3 3 3     3    SAB TAB Ectopic Multiple Live Births         0 3       Home Medications    Prior to Admission medications   Medication Sig Start Date End Date Taking? Authorizing Provider  diphenhydrAMINE (BENADRYL) 25 mg capsule Take 1 capsule (25 mg total) by mouth every 6 (six) hours  as needed for itching. 05/05/16   Montez MoritaLawson, Marie D, CNM  Prenatal Vit-Fe Fumarate-FA (PRENATAL MULTIVITAMIN) TABS tablet Take 1 tablet by mouth daily at 12 noon. 05/05/16   Montez MoritaLawson, Marie D, CNM    Family History Family History  Problem Relation Age of Onset  . Hypertension Mother     Social History Social History   Tobacco Use  . Smoking status: Never Smoker  . Smokeless tobacco: Never Used  Substance Use Topics  . Alcohol use: No  . Drug use: No     Allergies   Patient has no known allergies.   Review of Systems Review of Systems  Constitutional: Negative for chills and fever.  HENT: Negative for congestion, ear pain, rhinorrhea and sore throat.   Eyes: Positive for photophobia (L), pain (L), itching (L) and visual disturbance (L). Negative for discharge and redness.     Physical Exam Updated Vital Signs BP 110/74 (BP Location: Right Arm)   Pulse 77   Temp (!) 97.4 F (36.3 C) (Oral)   Resp 17   Wt 61.2 kg (135 lb)   LMP 04/16/2017   SpO2 100%   BMI 24.69 kg/m   Physical Exam  Constitutional: She appears well-developed and well-nourished.  Non-toxic appearance. No distress.  HENT:  Head: Normocephalic and atraumatic.  Right Ear: Tympanic membrane is not perforated, not erythematous, not retracted and not bulging.  Left Ear: Tympanic membrane is not perforated, not erythematous, not retracted and not bulging.  Nose: Nose normal.  Mouth/Throat: Uvula is midline and oropharynx is clear and moist.  Eyes: Conjunctivae and EOM are normal. Pupils are equal, round, and reactive to light. Right eye exhibits no discharge. Left eye exhibits no discharge. Right conjunctiva is not injected. Right conjunctiva has no hemorrhage. Left conjunctiva is not injected. Left conjunctiva has no hemorrhage.  L eye Woods lamp: There is fluorescein uptake at the 7:00 position of the L eye approximately 3mm in size, consistent with small corneal abrasion. No ulcer. No dendritic appearance. No  hyphema or hypopyon.   L eye IOP: 12 mm Hg  Neurological: She is alert.  Clear speech.   Psychiatric: She has a normal mood and affect. Her behavior is normal. Thought content normal.  Nursing note and vitals reviewed.   ED Treatments / Results  Labs (all labs ordered are listed, but only abnormal results are displayed) Labs Reviewed - No data to display  EKG  EKG Interpretation None      Radiology No results found.  Procedures Procedures (including critical care time)  Medications Ordered in ED Medications  fluorescein ophthalmic strip 1 strip (1 strip Left Eye Given 04/29/17 2035)  tetracaine (PONTOCAINE) 0.5 % ophthalmic solution 2 drop (2 drops Left Eye Given 04/29/17 2036)     Initial Impression / Assessment and Plan / ED Course  I have reviewed the triage vital signs and the nursing notes.  Pertinent labs & imaging results that were available during my care of the patient were reviewed by me and considered in my medical decision making (see chart for details).     Patient presents with exam consistent with corneal abrasion. She is nontoxic appearing with vitals WNL. Patient is not a contact lens wearer. Her tetanus is up to date. Patient with small 3 mm corneal abrasion at 7:00 position of L eye. Exam non-concerning for corneal ulcer, HSV, orbital/pre-orbital cellulitis, hyphema, hypopyon, or acute angle closure glaucoma. IOP 12 mm Hg. Significant decreased visual acuity in the L eye- discussed this with supervising physician Dr. Silverio Lay who instructed this is likely due to the abrasion. Patient will be discharged home with erythromycin and ophthalmology follow up. I discussed results, treatment plan, need for ophthalmology follow-up, and return precautions with the patient. Provided opportunity for questions, patient confirmed understanding and is in agreement with plan.   Findings and plan of care discussed with supervising physician Dr. Silverio Lay who is in agreement with plan.    Final Clinical Impressions(s) / ED Diagnoses   Final diagnoses:  Abrasion of left cornea, initial encounter    ED Discharge Orders        Ordered    erythromycin ophthalmic ointment     04/29/17 2221       Cherly Anderson, PA-C 04/30/17 0131    Charlynne Pander, MD 05/02/17 1504

## 2017-04-29 NOTE — ED Triage Notes (Signed)
Daughter/pt/translator stated, she has eye pain for 10 days. No injury to left eye

## 2017-04-29 NOTE — ED Triage Notes (Signed)
Translator stated, 2 weeks with vision is cloudy like something over it. It feels like something is in there.

## 2017-04-29 NOTE — ED Notes (Signed)
Pt vision acuity on the right eye 20/20 on the left eye was not even 20/160 pt having difficulty seeing out of the left eye only can see the C on that line.

## 2017-04-29 NOTE — ED Notes (Signed)
PT states understanding of care given, follow up care, and medication prescribed. PT ambulated from ED to car with a steady gait. 

## 2017-04-29 NOTE — Discharge Instructions (Signed)
You were seen in the emergency department and diagnosed with a corneal abrasion this is a cut on the surface of your eye.   I have given you an antibiotic ointment, apply this to your eye 4 times per day for the next 5 days.   Follow up with the ophthalmologist within 3 days. Return to the emergency department for new or worsening symptoms.    Google English to Spanish Translation:  Usted fue atendido en el departamento de emergencias y se le diagnostic una abrasin corneal; se trata de un corte en la superficie del ojo. Le he administrado un ungento antibitico, aplquelo en el ojo 4 veces al [place] durante los prximos 5 [place]. Haga un seguimiento con el oftalmlogo dentro de los 3 [address]  [address]. Regrese al departamento de emergencias para los sntomas

## 2017-06-21 ENCOUNTER — Ambulatory Visit (HOSPITAL_COMMUNITY)
Admission: RE | Admit: 2017-06-21 | Discharge: 2017-06-21 | Disposition: A | Discharge status: Home / Self Care | Payer: Self-pay | Source: Ambulatory Visit | Attending: Ophthalmology | Admitting: Ophthalmology

## 2017-06-21 ENCOUNTER — Other Ambulatory Visit (HOSPITAL_COMMUNITY): Payer: Self-pay | Admitting: Ophthalmology

## 2017-06-21 DIAGNOSIS — R7989 Other specified abnormal findings of blood chemistry: Secondary | ICD-10-CM
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
## 2018-04-30 ENCOUNTER — Ambulatory Visit (INDEPENDENT_AMBULATORY_CARE_PROVIDER_SITE_OTHER): Payer: Self-pay | Admitting: Ophthalmology

## 2018-04-30 ENCOUNTER — Encounter (INDEPENDENT_AMBULATORY_CARE_PROVIDER_SITE_OTHER): Payer: Self-pay | Admitting: Ophthalmology

## 2018-04-30 DIAGNOSIS — H25812 Combined forms of age-related cataract, left eye: Secondary | ICD-10-CM

## 2018-04-30 DIAGNOSIS — H44112 Panuveitis, left eye: Secondary | ICD-10-CM

## 2018-04-30 DIAGNOSIS — H3581 Retinal edema: Secondary | ICD-10-CM

## 2018-04-30 DIAGNOSIS — H35063 Retinal vasculitis, bilateral: Secondary | ICD-10-CM

## 2018-05-02 ENCOUNTER — Encounter (INDEPENDENT_AMBULATORY_CARE_PROVIDER_SITE_OTHER): Payer: Self-pay | Admitting: Ophthalmology

## 2018-10-04 IMAGING — DX DG CHEST 2V
2 series · 2 of 2 positions shown · non-contrast
Comparison: None.

CLINICAL DATA: Eye inflammation.  Uveitis.

EXAM:
CHEST  2 VIEW

[chest pa]
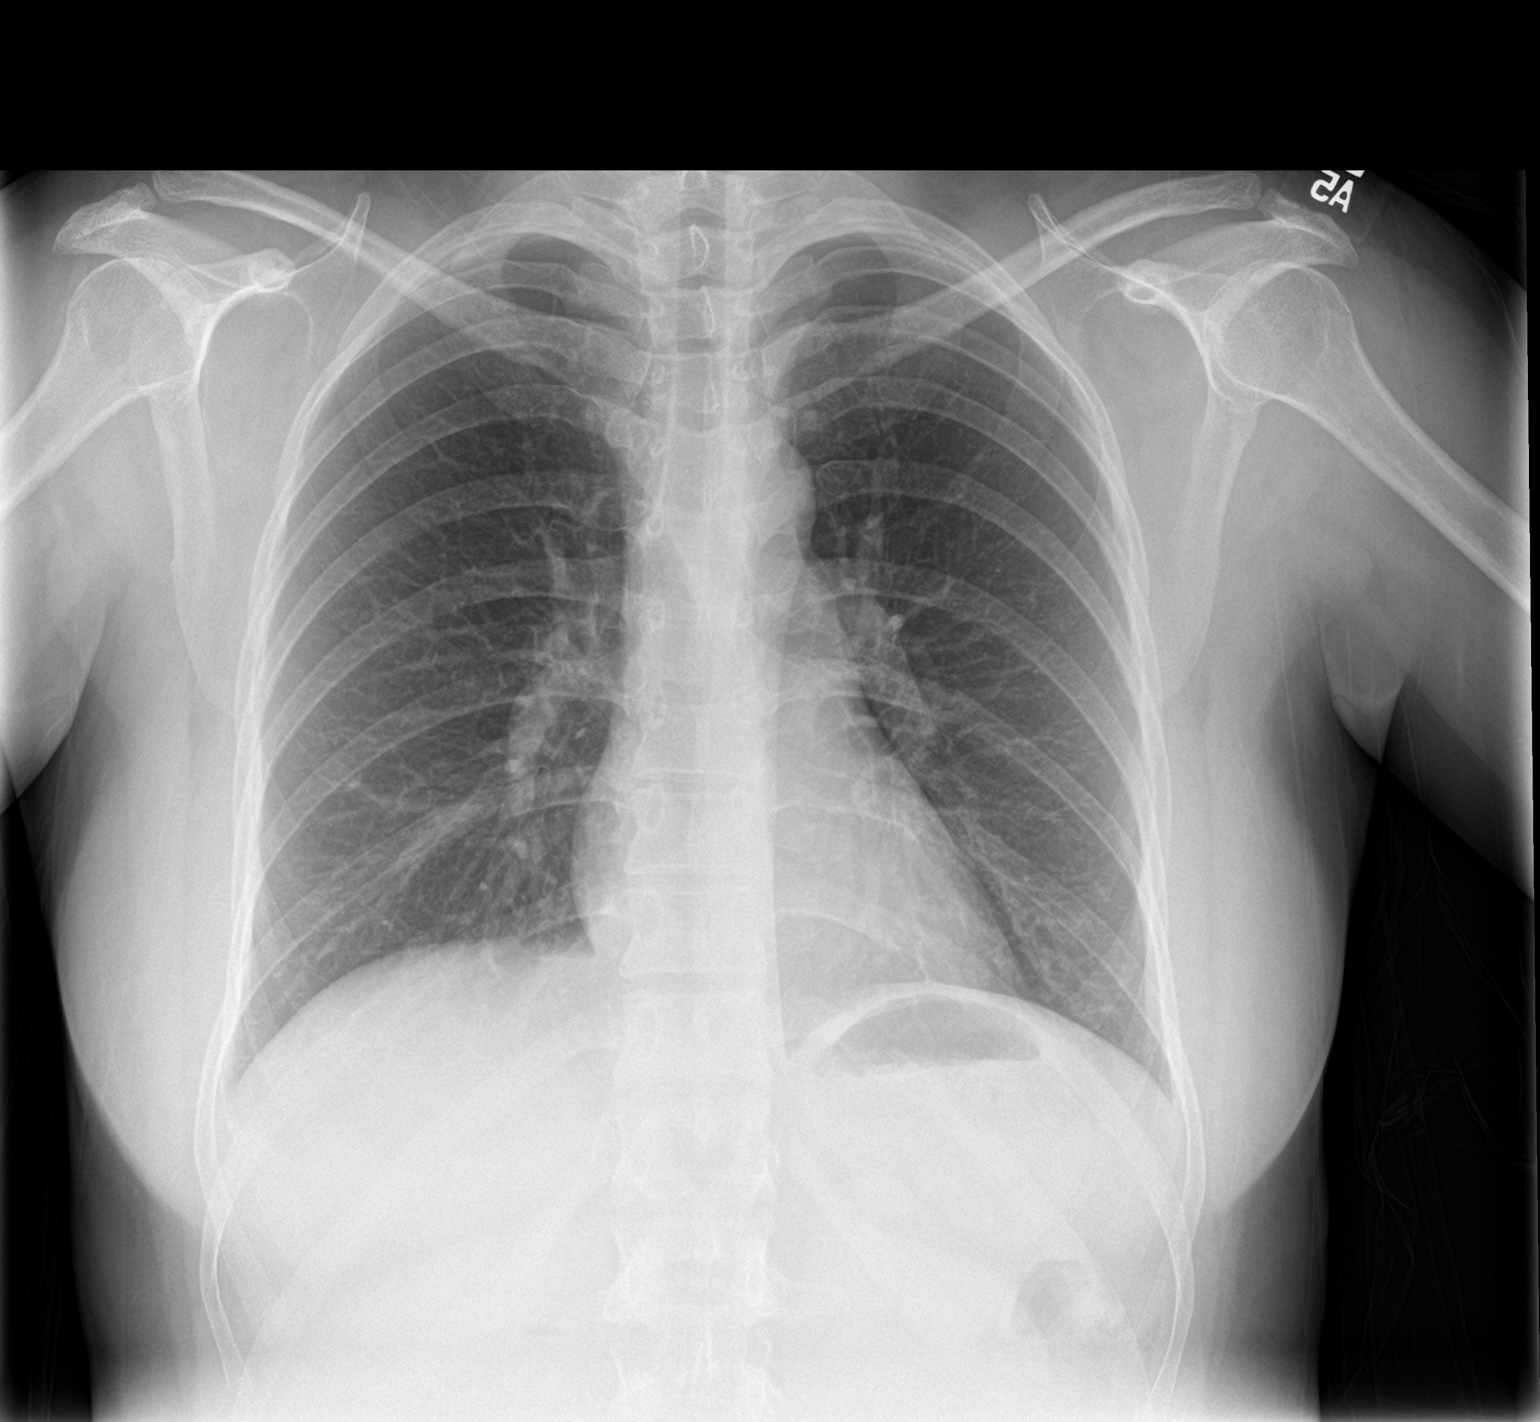

[chest lat]
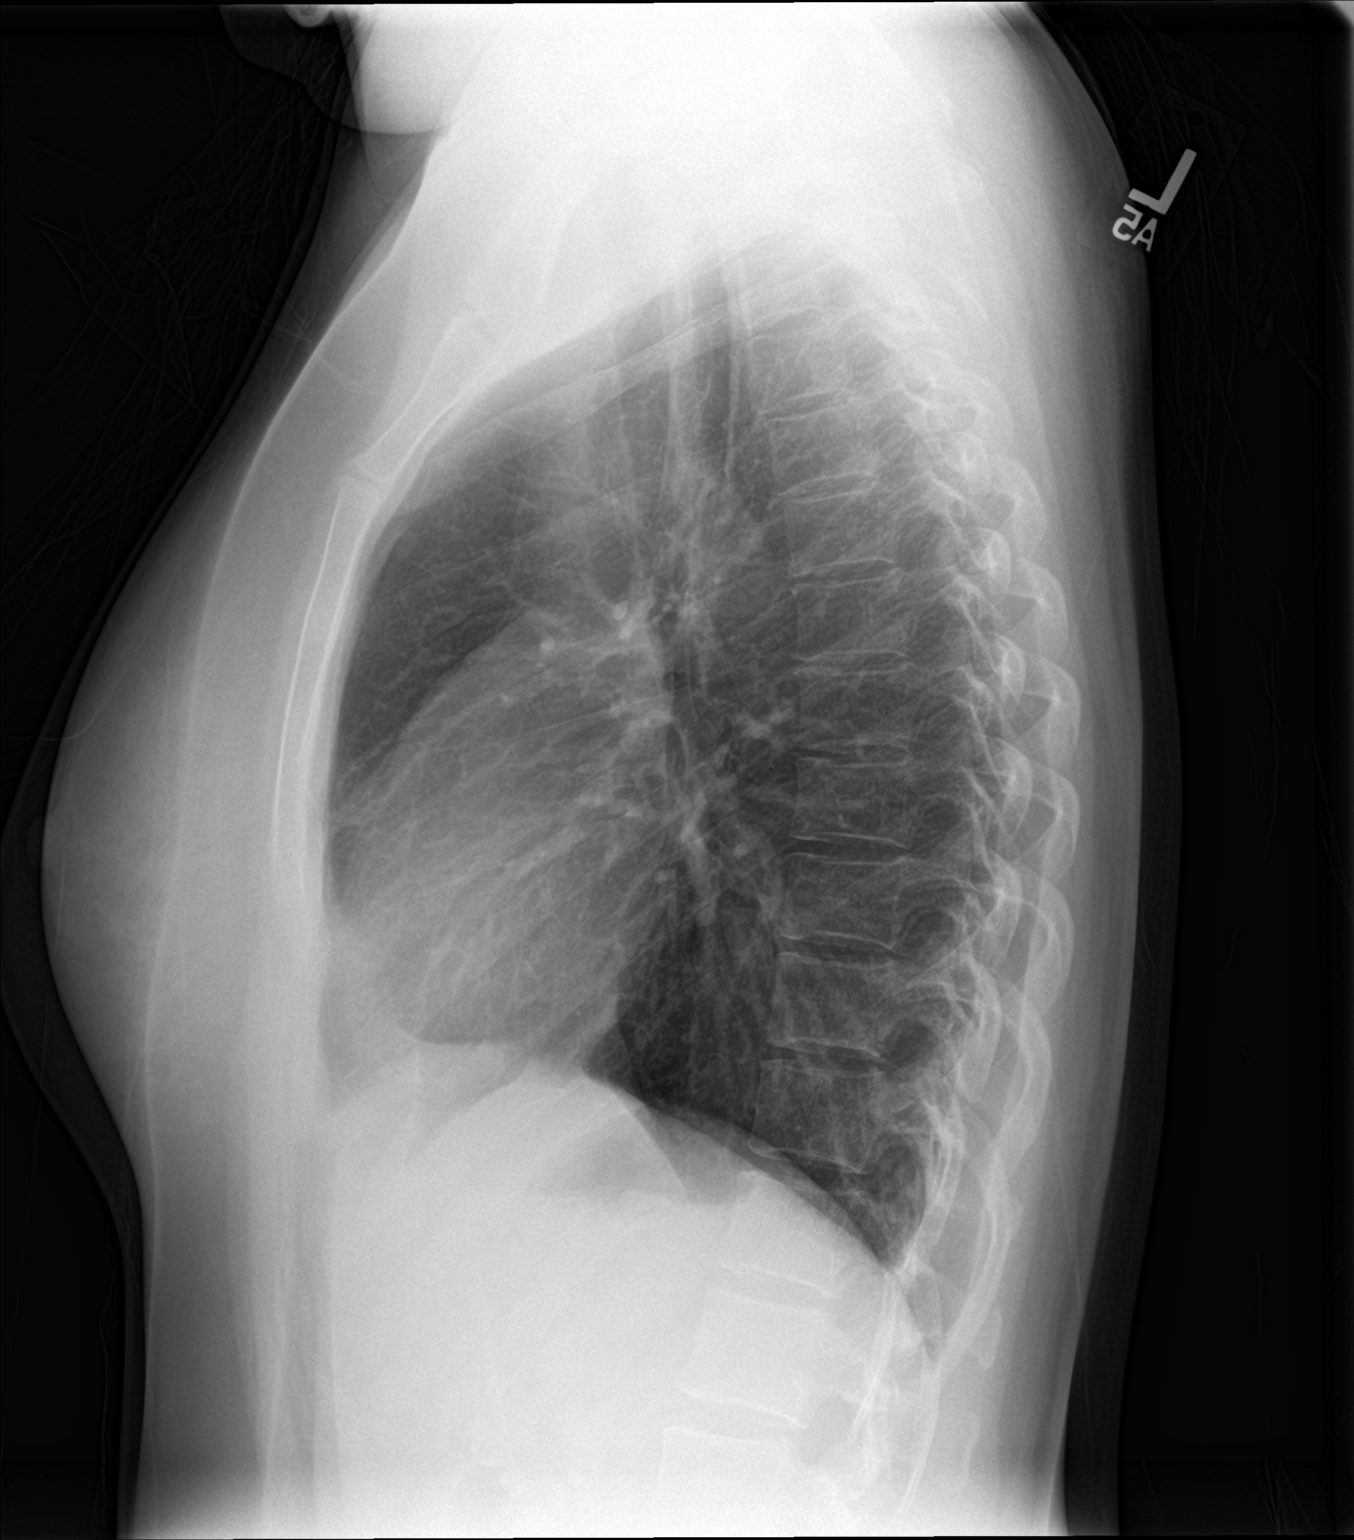

[2 of 2 positions shown; findings below may reference images not displayed]

FINDINGS: The heart size and mediastinal contours are within normal limits. No
evidence of mediastinal or hilar lymphadenopathy. Both lungs are
clear. No evidence of pleural effusion. The visualized skeletal
structures are unremarkable.
IMPRESSION: Negative.  No active cardiopulmonary disease.

## 2021-04-28 HISTORY — PX: EYE SURGERY: SHX253

## 2022-11-10 ENCOUNTER — Other Ambulatory Visit: Payer: Self-pay

## 2022-11-10 DIAGNOSIS — N6310 Unspecified lump in the right breast, unspecified quadrant: Secondary | ICD-10-CM

## 2022-11-11 ENCOUNTER — Ambulatory Visit: Payer: Self-pay | Admitting: *Deleted

## 2022-11-11 VITALS — BP 90/60 | Wt 137.7 lb

## 2022-11-11 DIAGNOSIS — N6312 Unspecified lump in the right breast, upper inner quadrant: Secondary | ICD-10-CM

## 2022-11-11 DIAGNOSIS — Z1239 Encounter for other screening for malignant neoplasm of breast: Secondary | ICD-10-CM

## 2022-11-11 NOTE — Progress Notes (Addendum)
[name] is a 37 y.o. female who presents to [facility] clinic today with complaint of lump at right breast with tenderness that she has experienced for past several years. Lump not associated with menstrual cycle.    Pap Smear: Pap not smear completed today. Last Pap smear was 11/13/2020 at [facility] clinic and was normal with negative HPV. Per patient has no history of an abnormal Pap smear. Last Pap smear result is not available in Epic.   Physical exam: Breasts Breasts symmetrical. No skin abnormalities bilateral breasts. No nipple retraction bilateral breasts. No nipple discharge bilateral breasts. No lymphadenopathy. Lumps palpated on right breast at 1 o'clock at 3 cm from nipple. Area is unchanged from previous exam.     CLINICAL DATA:  37 year old female complaining of focal pain and a  palpable abnormality in the right breast.   EXAM:  ULTRASOUND OF THE RIGHT BREAST   COMPARISON:  Prior ultrasound dated 05/21/2012.   FINDINGS:  On physical exam, I palpate thickening in the right breast at 1  o'clock 3 cm from the nipple.   Ultrasound is performed, showing there is a cluster of anechoic sub  cm cysts in the right breast at 1 o'clock 3 cm from the nipple  measuring 1.8 x 1.4 x 1.8 cm. No solid mass or abnormal shadowing  detected.   IMPRESSION:  Right breast cysts.  No evidence of malignancy.   RECOMMENDATION:  If the clinical exam remains benign/ stable screening mammography  can be deferred at the age 89.   I have discussed the findings and recommendations with the patient.  Results were also provided in writing at the conclusion of the  visit. If applicable, a reminder letter will be sent to the patient  regarding the next appointment.   BI-RADS CATEGORY  2: Benign Finding(s)    Electronically Signed    By: Baird Lyons M.D.    On: 10/05/2013 11:23  *RADIOLOGY REPORT*   Clinical Data:  Increasing pain, right breast lump.   RIGHT  BREAST ULTRASOUND   Comparison:  None.   On physical exam, I palpate a firm, mobile mass in the 12 o'clock  position, 3 cm the nipple.   Findings: Ultrasound is performed, showing a cluster of cyst in the  12 o'clock position, 3 cm the nipple measuring approximately 1.0 x  0.6 x 1.5 cm. No suspicious mass is seen.   IMPRESSION:  Cluster of cysts, right breast.   RECOMMENDATION:  Screening mammography at the age of 37 years old.   I have discussed the findings and recommendations with the patient.  Results were also provided in writing at the conclusion of the  visit.   BI-RADS CATEGORY 2:  Benign finding(s).    Original Report Authenticated By: Vincenza Hews, M.D.    Pelvic/Bimanual Pap is not indicated today    Smoking History: Patient has never smoked.  Not referred to quit line.    Patient Navigation: Patient education provided. Access to services provided for patient through BCCCP program. [place] [employer] interpreter provided. No transportation provided   Colorectal Cancer Screening: Per patient has never had colonoscopy completed. Not indicated due to age. No complaints today.    Breast and Cervical Cancer Risk Assessment: Patient does not have family history of breast cancer, known genetic mutations, or radiation treatment to the chest before age 71. Patient does not have history of cervical dysplasia, immunocompromised, or DES exposure in-utero.  Risk Scores as of Encounter on 11/11/2022  [name]           5-year 0.5%   Lifetime 11.2%   This patient is [place] but has no documented birth country, so the [place] model used data from [place] patients to calculate their risk score. Document a birth country in the Demographics activity for a more accurate score.         Last calculated by Meryl Dare, CMA on 11/11/2022 at  9:36 AM        A: BCCCP exam without pap smear Complaint of lump on right breast with tenderness.   Referred patient  to the [facility] for a diagnostic mammogram. Appointment scheduled 11/13/2022 at 1240 pm.  Joette Catching, RN 11/11/2022 10:26 AM   Attestation of Supervision of Student:  I confirm that I have verified the information documented in the nurse practitioner student's note and that I have also personally performed the history, physical exam and all medical decision making activities.  I have verified that all services and findings are accurately documented in this student's note; and I agree with management and plan as outlined in the documentation. I have also made any necessary editorial changes.  Breasts symmetrical. No skin abnormalities bilateral breasts. No nipple retraction bilateral breasts. No nipple discharge bilateral breasts. No lymphadenopathy. No lumps palpated left breast. Palpated lump in right breast at 1 o'clock 3 cm from the areola consistent with previous exam 05/18/2012. Patient complained of pain and discomfort when palpated lump within right breast.   Brannock, Kathaleen Maser, RN [facility] [employer], [facility] 11/11/2022 10:54 AM

## 2022-11-11 NOTE — Patient Instructions (Signed)
Explained breast self awareness with Clarene Reamer Vazquez-Garcia. Patient did not need a Pap smear today due to last Pap smear and HPV typing was 11/13/2020. Let her know BCCCP will cover Pap smears and HPV typing every 5 years unless has a history of abnormal Pap smears. Referred patient to the [facility] for diagnostic mammogram. Appointment scheduled for Thursday, November 13, 2022 at 1240. Patient aware of appointment and will be there. Clarene Reamer Vazquez-Garcia verbalized understanding.  Will Schier, Kathaleen Maser, RN 9:47 AM

## 2022-11-13 ENCOUNTER — Ambulatory Visit
Admission: RE | Admit: 2022-11-13 | Discharge: 2022-11-13 | Disposition: A | Discharge status: Home / Self Care | Payer: Self-pay | Source: Ambulatory Visit | Attending: Obstetrics and Gynecology | Admitting: Obstetrics and Gynecology

## 2022-11-13 ENCOUNTER — Other Ambulatory Visit: Payer: Self-pay | Admitting: Obstetrics and Gynecology

## 2022-11-13 ENCOUNTER — Ambulatory Visit
Admission: RE | Admit: 2022-11-13 | Discharge: 2022-11-13 | Disposition: A | Discharge status: Home / Self Care | Payer: No Typology Code available for payment source | Source: Ambulatory Visit | Attending: Obstetrics and Gynecology | Admitting: Obstetrics and Gynecology

## 2022-11-13 DIAGNOSIS — N6310 Unspecified lump in the right breast, unspecified quadrant: Secondary | ICD-10-CM

## 2022-11-17 ENCOUNTER — Ambulatory Visit
Admission: RE | Admit: 2022-11-17 | Discharge: 2022-11-17 | Disposition: A | Discharge status: Home / Self Care | Payer: No Typology Code available for payment source | Source: Ambulatory Visit | Attending: Obstetrics and Gynecology | Admitting: Obstetrics and Gynecology

## 2022-11-17 DIAGNOSIS — N6310 Unspecified lump in the right breast, unspecified quadrant: Secondary | ICD-10-CM

## 2022-11-17 HISTORY — PX: BREAST BIOPSY: SHX20

## 2022-12-04 ENCOUNTER — Ambulatory Visit: Payer: Self-pay | Admitting: Surgery

## 2022-12-04 DIAGNOSIS — D241 Benign neoplasm of right breast: Secondary | ICD-10-CM

## 2022-12-04 DIAGNOSIS — N6489 Other specified disorders of breast: Secondary | ICD-10-CM

## 2022-12-04 NOTE — H&P (Signed)
Subjective    Chief Complaint: New Consultation (R breast mass)   An interpreter was used throughout the visit.   History of Present Illness: Jodi Murray is a 37 y.o. female who is seen today as an office consultation at the request of Dr. Jolayne Panther for evaluation of New Consultation (R breast mass) .     This is a 37 year old female who presents with a palpable mass in the upper right breast for the last 12 years.  This seems to be enlarging slightly.  There is some mild associated discomfort.  The patient denies any family history of breast cancer.  Recently she underwent mammogram and ultrasound.  The mammogram revealed some distortion in the upper inner right breast with some adjacent oval masses.  Ultrasound showed a mass at 12:00 in the right breast located 4 cm from the nipple measuring 1.1 x 0.5 x 1.2 cm.  Just adjacent to this area there is another mass measuring 0.7 x 0.4 x 0.5 cm.  In the right breast at 1:00, 4 cm from the nipple there is an irregular hypoechoic mass measuring 2.2 x 0.9 x 1.0 cm.  The axilla was negative.     The mass located at 12:00 was biopsied and revealed a fibroadenoma with no evidence of atypia or malignancy.  The mass located at 1:00 revealed changes consistent with radial scar with no sign of atypia or malignancy.  The patient presents now to discuss excision of both of these areas.  No previous breast surgeries.   Review of Systems: A complete review of systems was obtained from the patient.  I have reviewed this information and discussed as appropriate with the patient.  See HPI as well for other ROS.   Review of Systems  Constitutional: Negative.   HENT: Negative.    Eyes: Negative.   Respiratory: Negative.    Cardiovascular: Negative.   Gastrointestinal: Negative.   Genitourinary: Negative.   Musculoskeletal: Negative.   Skin: Negative.   Neurological:  Positive for headaches.  Endo/Heme/Allergies: Negative.   Psychiatric/Behavioral:  Negative.          Medical History: Past Medical History  History reviewed. No pertinent past medical history.     Problem List     Patient Active Problem List  Diagnosis   Radial scar of right breast   Fibroadenoma of breast, right   Language barrier   Peripheral focal chorioretinal inflammation of both eyes   Retinal edema        Past Surgical History       Past Surgical History:  Procedure Laterality Date   Cataracts            Allergies  No Known Allergies     Medications Ordered Prior to Encounter  No current outpatient medications on file prior to visit.    No current facility-administered medications on file prior to visit.        Family History       Family History  Problem Relation Age of Onset   High blood pressure (Hypertension) Mother          Tobacco Use History  Social History       Tobacco Use  Smoking Status Never  Smokeless Tobacco Never        Social History  Social History        Socioeconomic History   Marital status: Single  Tobacco Use   Smoking status: Never   Smokeless tobacco: Never  Vaping Use   Vaping status:  Never Used  Substance and Sexual Activity   Alcohol use: Not Currently   Drug use: Never    Social Determinants of Health        Food Insecurity: No Food Insecurity (11/11/2022)    Received from [facility]    Hunger Vital Sign     Worried About Running Out of Food in the Last Year: Never true     Ran Out of Food in the Last Year: Never true  Transportation Needs: No Transportation Needs (11/11/2022)    Received from [facility] - Transportation     Lack of Transportation (Medical): No     Lack of Transportation (Non-Medical): No        Objective:          Vitals:    12/04/22 1338 12/04/22 1340  BP: 118/80    Pulse: 76    Temp: 36.8 C (98.3 F)    SpO2: 97%    Weight: 63 kg (138 lb 12.8 oz)    Height: 154.9 cm (5\' 1" )    PainSc:   0-No pain    Body mass index is 26.23  kg/m.   Physical Exam    Constitutional:  WDWN in NAD, conversant, no obvious deformities; lying in bed comfortably Eyes:  Pupils equal, round; sclera anicteric; moist conjunctiva; no lid lag HENT:  Oral mucosa moist; good dentition  Neck:  No masses palpated, trachea midline; no thyromegaly Lungs:  CTA bilaterally; normal respiratory effort Breasts:  symmetric, no nipple changes; significant ecchymosis in the upper central right breast.  There is some indistinct firmness in the upper breast but is difficult to tell whether this is hematoma from the biopsy or if this is the palpable mass.  No axillary lymphadenopathy on either side.  No left breast masses. CV:  Regular rate and rhythm; no murmurs; extremities well-perfused with no edema Abd:  +bowel sounds, soft, non-tender, no palpable organomegaly; no palpable hernias Musc: Normal gait; no apparent clubbing or cyanosis in extremities Lymphatic:  No palpable cervical or axillary lymphadenopathy Skin:  Warm, dry; no sign of jaundice Psychiatric - alert and oriented x 4; calm mood and affect     Labs, Imaging and Diagnostic Testing: Diagnosis 1. Breast, right, needle core biopsy, 1 o'clock position, 4 cmfn, venus clip PROLIFERATIVE FIBROCYSTIC CHANGES WITH SCLEROSING ADENOSIS AND CHANGES CONSISTENT WITH RADIAL SCAR NO EVIDENCE OF ATYPIA OR MALIGNANCY 2. Breast, right, needle core biopsy, 12 o'clock position, 4 cmfn, heart clip FIBROADENOMA NO EVIDENCE OF ATYPIA OR MALIGNANCY Diagnosis Note 1. [facility] informed of the diagnosis on 11/18/2022. Leitha Bleak M.D. Pathologist (Case signed 11/18/2022)     CLINICAL DATA:  37 year old female with history of right breast cysts.   EXAM: DIGITAL DIAGNOSTIC BILATERAL MAMMOGRAM WITH TOMOSYNTHESIS AND CAD; ULTRASOUND RIGHT BREAST LIMITED   TECHNIQUE: Bilateral digital diagnostic mammography and breast tomosynthesis was performed. The images were evaluated  with computer-aided detection. ; Targeted ultrasound examination of the right breast was performed   COMPARISON:  Previous right breast ultrasounds.   ACR Breast Density Category c: The breasts are heterogeneously dense, which may obscure small masses.   FINDINGS: There is a discrete area of persistent distortion in the upper slightly inner right breast with several adjacent linear oriented oval masses. No suspicious masses or calcifications are seen in the left breast.   Targeted ultrasound of the right breast was performed. There is a cluster of oval masses in the upper central right  breast. There is an oval circumscribed solid appearing hypoechoic mass in the right breast at 12 o'clock 4 cm from nipple measuring 1.1 x 0.5 x 1.2 cm. An adjacent similar appearing smaller mass also at 12 o'clock 4 cm from nipple measures 0.7 x 0.4 x 0.5 cm. There is an irregular hypoechoic mass in the right breast at 1 o'clock 4 cm from nipple felt to correspond with the distortion seen in the right breast on mammography. This measures 2.2 x 0.9 by 1 cm. Normal lymph nodes are present in the right axilla.   IMPRESSION: 1. Suspicious 2.2 cm mass in the right breast at the 1 o'clock position felt to correspond with distortion seen in the upper inner right breast on recent mammography.   2. Several additional similar appearing indeterminate hypoechoic masses in the right breast at 12 o'clock 4 cm from nipple.   RECOMMENDATION: 1. Recommend ultrasound-guided core biopsy of the 2.2 cm mass in the right breast at the 1 o'clock position.   2. Recommend ultrasound-guided core biopsy of the larger 1.2 cm oval mass in the right breast at 12 o'clock 4 cm from nipple.   I have discussed the findings and recommendations with the patient. If applicable, a reminder letter will be sent to the patient regarding the next appointment.   BI-RADS CATEGORY  4: Suspicious.     Electronically Signed   By:  Edwin Cap M.D.   On: 11/13/2022 14:05   Assessment and Plan:  Diagnoses and all orders for this visit:   Radial scar of right breast   Fibroadenoma of breast, right       Recommend right breast radioactive seed localized lumpectomy x two.  The surgical procedure has been discussed with the patient.  Potential risks, benefits, alternative treatments, and expected outcomes have been explained.  All of the patient's questions at this time have been answered.  The likelihood of reaching the patient's treatment goal is good.  The patient understands the proposed surgical procedure and wishes to proceed.   I explained to the patient that I would like to wait for several weeks before scheduling surgery to allow the hematoma to resolve.  She understands that there is no significant urgency to performing this procedure.     Jerrelle Michelsen Delbert Harness, MD  12/04/2022 4:26 PM

## 2022-12-04 NOTE — H&P (View-Only) (Signed)
 Subjective    Chief Complaint: New Consultation (R breast mass)   An interpreter was used throughout the visit.   History of Present Illness: Jodi Murray is a 37 y.o. female who is seen today as an office consultation at the request of Dr. Jolayne Panther for evaluation of New Consultation (R breast mass) .     This is a 37 year old female who presents with a palpable mass in the upper right breast for the last 12 years.  This seems to be enlarging slightly.  There is some mild associated discomfort.  The patient denies any family history of breast cancer.  Recently she underwent mammogram and ultrasound.  The mammogram revealed some distortion in the upper inner right breast with some adjacent oval masses.  Ultrasound showed a mass at 12:00 in the right breast located 4 cm from the nipple measuring 1.1 x 0.5 x 1.2 cm.  Just adjacent to this area there is another mass measuring 0.7 x 0.4 x 0.5 cm.  In the right breast at 1:00, 4 cm from the nipple there is an irregular hypoechoic mass measuring 2.2 x 0.9 x 1.0 cm.  The axilla was negative.     The mass located at 12:00 was biopsied and revealed a fibroadenoma with no evidence of atypia or malignancy.  The mass located at 1:00 revealed changes consistent with radial scar with no sign of atypia or malignancy.  The patient presents now to discuss excision of both of these areas.  No previous breast surgeries.   Review of Systems: A complete review of systems was obtained from the patient.  I have reviewed this information and discussed as appropriate with the patient.  See HPI as well for other ROS.   Review of Systems  Constitutional: Negative.   HENT: Negative.    Eyes: Negative.   Respiratory: Negative.    Cardiovascular: Negative.   Gastrointestinal: Negative.   Genitourinary: Negative.   Musculoskeletal: Negative.   Skin: Negative.   Neurological:  Positive for headaches.  Endo/Heme/Allergies: Negative.   Psychiatric/Behavioral:  Negative.          Medical History: Past Medical History  History reviewed. No pertinent past medical history.     Problem List     Patient Active Problem List  Diagnosis   Radial scar of right breast   Fibroadenoma of breast, right   Language barrier   Peripheral focal chorioretinal inflammation of both eyes   Retinal edema        Past Surgical History       Past Surgical History:  Procedure Laterality Date   Cataracts            Allergies  No Known Allergies     Medications Ordered Prior to Encounter  No current outpatient medications on file prior to visit.    No current facility-administered medications on file prior to visit.        Family History       Family History  Problem Relation Age of Onset   High blood pressure (Hypertension) Mother          Tobacco Use History  Social History       Tobacco Use  Smoking Status Never  Smokeless Tobacco Never        Social History  Social History        Socioeconomic History   Marital status: Single  Tobacco Use   Smoking status: Never   Smokeless tobacco: Never  Vaping Use   Vaping status:  Never Used  Substance and Sexual Activity   Alcohol use: Not Currently   Drug use: Never    Social Determinants of Health        Food Insecurity: No Food Insecurity (11/11/2022)    Received from [facility]    Hunger Vital Sign     Worried About Running Out of Food in the Last Year: Never true     Ran Out of Food in the Last Year: Never true  Transportation Needs: No Transportation Needs (11/11/2022)    Received from [facility] - Transportation     Lack of Transportation (Medical): No     Lack of Transportation (Non-Medical): No        Objective:          Vitals:    12/04/22 1338 12/04/22 1340  BP: 118/80    Pulse: 76    Temp: 36.8 C (98.3 F)    SpO2: 97%    Weight: 63 kg (138 lb 12.8 oz)    Height: 154.9 cm (5\' 1" )    PainSc:   0-No pain    Body mass index is 26.23  kg/m.   Physical Exam    Constitutional:  WDWN in NAD, conversant, no obvious deformities; lying in bed comfortably Eyes:  Pupils equal, round; sclera anicteric; moist conjunctiva; no lid lag HENT:  Oral mucosa moist; good dentition  Neck:  No masses palpated, trachea midline; no thyromegaly Lungs:  CTA bilaterally; normal respiratory effort Breasts:  symmetric, no nipple changes; significant ecchymosis in the upper central right breast.  There is some indistinct firmness in the upper breast but is difficult to tell whether this is hematoma from the biopsy or if this is the palpable mass.  No axillary lymphadenopathy on either side.  No left breast masses. CV:  Regular rate and rhythm; no murmurs; extremities well-perfused with no edema Abd:  +bowel sounds, soft, non-tender, no palpable organomegaly; no palpable hernias Musc: Normal gait; no apparent clubbing or cyanosis in extremities Lymphatic:  No palpable cervical or axillary lymphadenopathy Skin:  Warm, dry; no sign of jaundice Psychiatric - alert and oriented x 4; calm mood and affect     Labs, Imaging and Diagnostic Testing: Diagnosis 1. Breast, right, needle core biopsy, 1 o'clock position, 4 cmfn, venus clip PROLIFERATIVE FIBROCYSTIC CHANGES WITH SCLEROSING ADENOSIS AND CHANGES CONSISTENT WITH RADIAL SCAR NO EVIDENCE OF ATYPIA OR MALIGNANCY 2. Breast, right, needle core biopsy, 12 o'clock position, 4 cmfn, heart clip FIBROADENOMA NO EVIDENCE OF ATYPIA OR MALIGNANCY Diagnosis Note 1. [facility] informed of the diagnosis on 11/18/2022. Leitha Bleak M.D. Pathologist (Case signed 11/18/2022)     CLINICAL DATA:  37 year old female with history of right breast cysts.   EXAM: DIGITAL DIAGNOSTIC BILATERAL MAMMOGRAM WITH TOMOSYNTHESIS AND CAD; ULTRASOUND RIGHT BREAST LIMITED   TECHNIQUE: Bilateral digital diagnostic mammography and breast tomosynthesis was performed. The images were evaluated  with computer-aided detection. ; Targeted ultrasound examination of the right breast was performed   COMPARISON:  Previous right breast ultrasounds.   ACR Breast Density Category c: The breasts are heterogeneously dense, which may obscure small masses.   FINDINGS: There is a discrete area of persistent distortion in the upper slightly inner right breast with several adjacent linear oriented oval masses. No suspicious masses or calcifications are seen in the left breast.   Targeted ultrasound of the right breast was performed. There is a cluster of oval masses in the upper central right  breast. There is an oval circumscribed solid appearing hypoechoic mass in the right breast at 12 o'clock 4 cm from nipple measuring 1.1 x 0.5 x 1.2 cm. An adjacent similar appearing smaller mass also at 12 o'clock 4 cm from nipple measures 0.7 x 0.4 x 0.5 cm. There is an irregular hypoechoic mass in the right breast at 1 o'clock 4 cm from nipple felt to correspond with the distortion seen in the right breast on mammography. This measures 2.2 x 0.9 by 1 cm. Normal lymph nodes are present in the right axilla.   IMPRESSION: 1. Suspicious 2.2 cm mass in the right breast at the 1 o'clock position felt to correspond with distortion seen in the upper inner right breast on recent mammography.   2. Several additional similar appearing indeterminate hypoechoic masses in the right breast at 12 o'clock 4 cm from nipple.   RECOMMENDATION: 1. Recommend ultrasound-guided core biopsy of the 2.2 cm mass in the right breast at the 1 o'clock position.   2. Recommend ultrasound-guided core biopsy of the larger 1.2 cm oval mass in the right breast at 12 o'clock 4 cm from nipple.   I have discussed the findings and recommendations with the patient. If applicable, a reminder letter will be sent to the patient regarding the next appointment.   BI-RADS CATEGORY  4: Suspicious.     Electronically Signed   By:  Edwin Cap M.D.   On: 11/13/2022 14:05   Assessment and Plan:  Diagnoses and all orders for this visit:   Radial scar of right breast   Fibroadenoma of breast, right       Recommend right breast radioactive seed localized lumpectomy x two.  The surgical procedure has been discussed with the patient.  Potential risks, benefits, alternative treatments, and expected outcomes have been explained.  All of the patient's questions at this time have been answered.  The likelihood of reaching the patient's treatment goal is good.  The patient understands the proposed surgical procedure and wishes to proceed.   I explained to the patient that I would like to wait for several weeks before scheduling surgery to allow the hematoma to resolve.  She understands that there is no significant urgency to performing this procedure.     MATTHEW Delbert Harness, MD  12/04/2022 4:26 PM

## 2022-12-08 ENCOUNTER — Other Ambulatory Visit: Payer: Self-pay | Admitting: Surgery

## 2022-12-08 DIAGNOSIS — D241 Benign neoplasm of right breast: Secondary | ICD-10-CM

## 2022-12-16 NOTE — Pre-Procedure Instructions (Signed)
Surgical Instructions   Your procedure is scheduled on December 24, 2022. Report to [facility] Main Entrance "A" at 7:45 A.M., then check in with the Admitting office. Any questions or running late day of surgery: call [phone]  Questions prior to your surgery date: call [phone], Monday-Friday, 8am-4pm. If you experience any cold or flu symptoms such as cough, fever, chills, shortness of breath, etc. between now and your scheduled surgery, please notify us at the above number.     Remember:  Do not eat after midnight the night before your surgery   You may drink clear liquids until 6:45 AM the morning of your surgery.   Clear liquids allowed are: Water, Non-Citrus Juices (without pulp), Carbonated Beverages, Clear Tea, Black Coffee Only (NO MILK, CREAM OR POWDERED CREAMER of any kind), and Gatorade.    Take these medicines the morning of surgery with A SIP OF WATER: acetaminophen (TYLENOL) - may take if needed   One week prior to surgery, STOP taking any Aspirin (unless otherwise instructed by your surgeon) Aleve, Naproxen, Ibuprofen, Motrin, Advil, Goody's, BC's, all herbal medications, fish oil, and non-prescription vitamins.                     Do NOT Smoke (Tobacco/Vaping) for 24 hours prior to your procedure.  If you use a CPAP at night, you may bring your mask/headgear for your overnight stay.   You will be asked to remove any contacts, glasses, piercing's, hearing aid's, dentures/partials prior to surgery. Please bring cases for these items if needed.    Patients discharged the day of surgery will not be allowed to drive home, and someone needs to stay with them for 24 hours.  SURGICAL WAITING ROOM VISITATION Patients may have no more than 2 support people in the waiting area - these visitors may rotate.   Pre-op nurse will coordinate an appropriate time for 1 ADULT support person, who may not rotate, to accompany patient in pre-op.  Children under the age of 21  must have an adult with them who is not the patient and must remain in the main waiting area with an adult.  If the patient needs to stay at the hospital during part of their recovery, the visitor guidelines for inpatient rooms apply.  Please refer to the [facility] website for the visitor guidelines for any additional information.   If you received a COVID test during your pre-op visit  it is requested that you wear a mask when out in public, stay away from anyone that may not be feeling well and notify your surgeon if you develop symptoms. If you have been in contact with anyone that has tested positive in the last 10 days please notify you surgeon.      Pre-operative CHG Bathing Instructions   You can play a key role in reducing the risk of infection after surgery. Your skin needs to be as free of germs as possible. You can reduce the number of germs on your skin by washing with CHG (chlorhexidine gluconate) soap before surgery. CHG is an antiseptic soap that kills germs and continues to kill germs even after washing.   DO NOT use if you have an allergy to chlorhexidine/CHG or antibacterial soaps. If your skin becomes reddened or irritated, stop using the CHG and notify one of our RNs at [phone].              TAKE A SHOWER THE NIGHT BEFORE SURGERY AND THE DAY  OF SURGERY    Please keep in mind the following:  DO NOT shave, including legs and underarms, 48 hours prior to surgery.   You may shave your face before/day of surgery.  Place clean sheets on your bed the night before surgery Use a clean washcloth (not used since being washed) for each shower. DO NOT sleep with pet's night before surgery.  CHG Shower Instructions:  If you choose to wash your hair and private area, wash first with your normal shampoo/soap.  After you use shampoo/soap, rinse your hair and body thoroughly to remove shampoo/soap residue.  Turn the water OFF and apply half the bottle of CHG soap to a CLEAN  washcloth.  Apply CHG soap ONLY FROM YOUR NECK DOWN TO YOUR TOES (washing for 3-5 minutes)  DO NOT use CHG soap on face, private areas, open wounds, or sores.  Pay special attention to the area where your surgery is being performed.  If you are having back surgery, having someone wash your back for you may be helpful. Wait 2 minutes after CHG soap is applied, then you may rinse off the CHG soap.  Pat dry with a clean towel  Put on clean pajamas    Additional instructions for the day of surgery: DO NOT APPLY any lotions, deodorants, cologne, or perfumes.   Do not wear jewelry or makeup Do not wear nail polish, gel polish, artificial nails, or any other type of covering on natural nails (fingers and toes) Do not bring valuables to the hospital. [facility] is not responsible for valuables/personal belongings. Put on clean/comfortable clothes.  Please brush your teeth.  Ask your nurse before applying any prescription medications to the skin.

## 2022-12-17 ENCOUNTER — Encounter (HOSPITAL_COMMUNITY): Payer: Self-pay

## 2022-12-17 ENCOUNTER — Other Ambulatory Visit: Payer: Self-pay

## 2022-12-17 ENCOUNTER — Encounter (HOSPITAL_COMMUNITY)
Admission: RE | Admit: 2022-12-17 | Discharge: 2022-12-17 | Disposition: A | Discharge status: Home / Self Care | Payer: Self-pay | Source: Ambulatory Visit | Attending: Surgery | Admitting: Surgery

## 2022-12-17 VITALS — BP 105/72 | HR 71 | Temp 98.4°F | Resp 18 | Ht 64.0 in | Wt 136.6 lb

## 2022-12-17 DIAGNOSIS — N6489 Other specified disorders of breast: Secondary | ICD-10-CM | POA: Insufficient documentation

## 2022-12-17 DIAGNOSIS — Z01812 Encounter for preprocedural laboratory examination: Secondary | ICD-10-CM | POA: Insufficient documentation

## 2022-12-17 DIAGNOSIS — D241 Benign neoplasm of right breast: Secondary | ICD-10-CM | POA: Insufficient documentation

## 2022-12-17 DIAGNOSIS — Z01818 Encounter for other preprocedural examination: Secondary | ICD-10-CM

## 2022-12-17 HISTORY — DX: Malignant (primary) neoplasm, unspecified: C80.1

## 2022-12-17 HISTORY — DX: Nausea with vomiting, unspecified: Z98.890

## 2022-12-17 HISTORY — DX: Headache, unspecified: R51.9

## 2022-12-17 LAB — CBC
HCT: 36.8 % (ref 36.0–46.0)
Hemoglobin: 11.8 g/dL — ABNORMAL LOW (ref 12.0–15.0)
MCH: 27.5 pg (ref 26.0–34.0)
MCHC: 32.1 g/dL (ref 30.0–36.0)
MCV: 85.8 fL (ref 80.0–100.0)
Platelets: 278 10*3/uL (ref 150–400)
RBC: 4.29 MIL/uL (ref 3.87–5.11)
RDW: 13.3 % (ref 11.5–15.5)
WBC: 7 10*3/uL (ref 4.0–10.5)
nRBC: 0 % (ref 0.0–0.2)

## 2022-12-17 NOTE — Progress Notes (Signed)
PCP - Peggy Constant,MD Cardiologist - denies  PPM/ICD - denies Device Orders -  Rep Notified -   Chest x-ray - na EKG - na Stress Test - denies ECHO - denies Cardiac Cath - denies  Sleep Study - none CPAP -   Fasting Blood Sugar - na Checks Blood Sugar _____ times a day  Last dose of GLP1 agonist-  na GLP1 instructions:   Blood Thinner Instructions:na Aspirin Instructions:na  ERAS Protcol - clear liquids until 0645 PRE-SURGERY Ensure or G2- no  COVID TEST- na   Anesthesia review: yes- seed patient  Patient denies shortness of breath, fever, cough and chest pain at PAT appointment   All instructions explained to the patient, with a verbal understanding of the material. Patient agrees to go over the instructions while at home for a better understanding. Patient also instructed to wear a mask when out in public prior to surgery. The opportunity to ask questions was provided.

## 2022-12-18 NOTE — Progress Notes (Addendum)
Anesthesia Chart Review:   Case: [id number] Date/Time: 12/24/22 0930   Procedure: RIGHT BREAST LUMPECTOMY WITH RADIOACTIVE SEED LOCALIZATION x2 (Right) - LMA   Anesthesia type: General   Pre-op diagnosis: RIGHT BREAST RADIAL SCAR, RIGHT BREAST FIBROADENOMA   Location: [facility] ROOM 08 / [facility]   Surgeons: Manus Rudd, MD       DISCUSSION: Patient is a 37 year old female scheduled for the above procedure. She is Spanish speaking.  History includes never smoker, post-operative N/V, cancer (not specified). She has known palpable right breast mass, felt to be slightly enlarging. Right breast biopsies x2 on 11/17/22 showed fibroadenoma and proliferative fibrocystic changes with sclerosing adenosis and changes with radial scar, no evidence of atypica or malignancy.  RSL is scheduled for 12/23/22 at 1:00 PM. She is scheduled to come in for urine pregnancy test on 12/23/22 ~ 11:00 AM. (UPDATE 12/23/22 1:37 PM: 12/23/22 Urine pregnancy test was negative.)   VS: BP 105/72   Pulse 71   Temp 36.9 C (Oral)   Resp 18   Ht 5\' 4"  (1.626 m)   Wt 62 kg   LMP 12/08/2022   SpO2 100%   BMI 23.45 kg/m   PROVIDERS: Constant, Peggy, MD is GYN   LABS: Labs reviewed: Acceptable for surgery. (all labs ordered are listed, but only abnormal results are displayed)  Labs Reviewed  CBC - Abnormal; Notable for the following components:      Result Value   Hemoglobin 11.8 (*)    All other components within normal limits    EKG: N/A   CV: N/A   Past Medical History:  Diagnosis Date   Breast lump on right side at 1 o'clock position 05/18/2012   Referred patient to the [facility] for right breast ultrasound. Appointment scheduled for Friday, May 21, 2012 at 0930. Negative w/u. Routine screening at age 58 and BSE prior.    Cancer [facility])    Headache    Medical history non-contributory    PONV (postoperative nausea and vomiting)     Past Surgical History:  Procedure Laterality Date    BREAST BIOPSY Right 11/17/2022   [place] RT BREAST BX W LOC DEV 1ST LESION IMG BX SPEC US GUIDE 11/17/2022 [facility]   BREAST BIOPSY Right 11/17/2022   [place] RT BREAST BX W LOC DEV EA ADD LESION IMG BX SPEC US GUIDE 11/17/2022 [facility]   EYE SURGERY Left 2023   cataract surgery   NO PAST SURGERIES      MEDICATIONS:  acetaminophen (TYLENOL) 325 MG tablet   Ascorbic Acid (VITAMIN C PO)   VITAMIN D PO   No current facility-administered medications for this encounter.    Shonna Chock, PA-C Surgical Short Stay/Anesthesiology [facility] Phone [phone] [facility] Phone [phone] 12/18/2022 4:07 PM

## 2022-12-18 NOTE — Anesthesia Preprocedure Evaluation (Addendum)
Anesthesia Evaluation  Patient identified by MRN, date of birth, ID band Patient awake    Reviewed: Allergy & Precautions, NPO status , Patient's Chart, lab work & pertinent test results  History of Anesthesia Complications (+) PONV and history of anesthetic complications  Airway Mallampati: I       Dental no notable dental hx.    Pulmonary neg pulmonary ROS   Pulmonary exam normal        Cardiovascular negative cardio ROS Normal cardiovascular exam     Neuro/Psych  negative psych ROS   GI/Hepatic negative GI ROS, Neg liver ROS,,,  Endo/Other  negative endocrine ROS    Renal/GU negative Renal ROS  negative genitourinary   Musculoskeletal negative musculoskeletal ROS (+)    Abdominal Normal abdominal exam  (+)   Peds  Hematology negative hematology ROS (+)   Anesthesia Other Findings   Reproductive/Obstetrics negative OB ROS                             Anesthesia Physical Anesthesia Plan  ASA: 2  Anesthesia Plan: General   Post-op Pain Management: Minimal or no pain anticipated   Induction: Intravenous  PONV Risk Score and Plan: 4 or greater and Ondansetron, Dexamethasone and Midazolam  Airway Management Planned: LMA  Additional Equipment: None  Intra-op Plan:   Post-operative Plan: Extubation in OR  Informed Consent: I have reviewed the patients History and Physical, chart, labs and discussed the procedure including the risks, benefits and alternatives for the proposed anesthesia with the patient or authorized representative who has indicated his/her understanding and acceptance.     Dental advisory given  Plan Discussed with: CRNA  Anesthesia Plan Comments: (PAT note written 12/18/2022 by Shonna Chock, PA-C. Spanish speaking.  )       Anesthesia Quick Evaluation

## 2022-12-23 ENCOUNTER — Ambulatory Visit
Admission: RE | Admit: 2022-12-23 | Discharge: 2022-12-23 | Disposition: A | Discharge status: Home / Self Care | Payer: No Typology Code available for payment source | Source: Ambulatory Visit | Attending: Surgery | Admitting: Surgery

## 2022-12-23 ENCOUNTER — Encounter (HOSPITAL_COMMUNITY)
Admission: RE | Admit: 2022-12-23 | Discharge: 2022-12-23 | Disposition: A | Discharge status: Home / Self Care | Payer: Self-pay | Source: Ambulatory Visit | Attending: Surgery | Admitting: Surgery

## 2022-12-23 DIAGNOSIS — Z01812 Encounter for preprocedural laboratory examination: Secondary | ICD-10-CM | POA: Insufficient documentation

## 2022-12-23 DIAGNOSIS — Z01818 Encounter for other preprocedural examination: Secondary | ICD-10-CM

## 2022-12-23 DIAGNOSIS — D241 Benign neoplasm of right breast: Secondary | ICD-10-CM

## 2022-12-23 DIAGNOSIS — N6489 Other specified disorders of breast: Secondary | ICD-10-CM

## 2022-12-23 HISTORY — PX: BREAST BIOPSY: SHX20

## 2022-12-23 LAB — POCT PREGNANCY, URINE: Preg Test, Ur: NEGATIVE

## 2022-12-24 ENCOUNTER — Ambulatory Visit (HOSPITAL_BASED_OUTPATIENT_CLINIC_OR_DEPARTMENT_OTHER): Payer: No Typology Code available for payment source

## 2022-12-24 ENCOUNTER — Other Ambulatory Visit: Payer: Self-pay

## 2022-12-24 ENCOUNTER — Ambulatory Visit
Admission: RE | Admit: 2022-12-24 | Discharge: 2022-12-24 | Disposition: A | Discharge status: Home / Self Care | Payer: No Typology Code available for payment source | Source: Ambulatory Visit | Attending: Surgery | Admitting: Surgery

## 2022-12-24 ENCOUNTER — Ambulatory Visit (HOSPITAL_COMMUNITY)
Admission: RE | Admit: 2022-12-24 | Discharge: 2022-12-24 | Disposition: A | Discharge status: Home / Self Care | Payer: No Typology Code available for payment source | Attending: Surgery | Admitting: Surgery

## 2022-12-24 ENCOUNTER — Encounter (HOSPITAL_COMMUNITY)
Admission: RE | Disposition: A | Discharge status: Home / Self Care | Payer: Self-pay | Source: Home / Self Care | Attending: Surgery

## 2022-12-24 ENCOUNTER — Encounter (HOSPITAL_COMMUNITY): Payer: Self-pay | Admitting: Surgery

## 2022-12-24 ENCOUNTER — Ambulatory Visit (HOSPITAL_COMMUNITY): Payer: No Typology Code available for payment source | Admitting: Vascular Surgery

## 2022-12-24 DIAGNOSIS — D241 Benign neoplasm of right breast: Secondary | ICD-10-CM

## 2022-12-24 DIAGNOSIS — N6011 Diffuse cystic mastopathy of right breast: Secondary | ICD-10-CM | POA: Insufficient documentation

## 2022-12-24 DIAGNOSIS — L905 Scar conditions and fibrosis of skin: Secondary | ICD-10-CM | POA: Insufficient documentation

## 2022-12-24 DIAGNOSIS — N6021 Fibroadenosis of right breast: Secondary | ICD-10-CM | POA: Insufficient documentation

## 2022-12-24 HISTORY — PX: BREAST LUMPECTOMY WITH RADIOACTIVE SEED LOCALIZATION: SHX6424

## 2022-12-24 SURGERY — BREAST LUMPECTOMY WITH RADIOACTIVE SEED LOCALIZATION
Anesthesia: General | Site: Breast | Laterality: Right

## 2022-12-24 MED ORDER — CEFAZOLIN SODIUM-DEXTROSE 2-4 GM/100ML-% IV SOLN
2.0000 g | INTRAVENOUS | Status: AC
  Administered 2022-12-24: 2 g via INTRAVENOUS

## 2022-12-24 MED ORDER — LACTATED RINGERS IV SOLN: INTRAVENOUS | Status: DC

## 2022-12-24 MED ORDER — FENTANYL CITRATE (PF) 250 MCG/5ML IJ SOLN
INTRAMUSCULAR | Status: DC | PRN
  Administered 2022-12-24 (×3): 50 ug via INTRAVENOUS

## 2022-12-24 MED ORDER — FENTANYL CITRATE (PF) 100 MCG/2ML IJ SOLN: 25.0000 ug | INTRAMUSCULAR | Status: DC | PRN

## 2022-12-24 MED ORDER — OXYCODONE HCL 5 MG/5ML PO SOLN: 5.0000 mg | Freq: Once | ORAL | Status: DC | PRN

## 2022-12-24 MED ORDER — DEXAMETHASONE SODIUM PHOSPHATE 10 MG/ML IJ SOLN
INTRAMUSCULAR | Status: AC
  Filled 2022-12-24: qty 1

## 2022-12-24 MED ORDER — ORAL CARE MOUTH RINSE: 15.0000 mL | Freq: Once | OROMUCOSAL | Status: AC

## 2022-12-24 MED ORDER — GLYCOPYRROLATE 0.2 MG/ML IJ SOLN
INTRAMUSCULAR | Status: DC | PRN
  Administered 2022-12-24: .1 mg via INTRAVENOUS

## 2022-12-24 MED ORDER — DEXAMETHASONE SODIUM PHOSPHATE 10 MG/ML IJ SOLN
INTRAMUSCULAR | Status: DC | PRN
  Administered 2022-12-24: 10 mg via INTRAVENOUS

## 2022-12-24 MED ORDER — ONDANSETRON HCL 4 MG/2ML IJ SOLN
INTRAMUSCULAR | Status: AC
  Filled 2022-12-24: qty 2

## 2022-12-24 MED ORDER — BUPIVACAINE-EPINEPHRINE (PF) 0.25% -1:200000 IJ SOLN
INTRAMUSCULAR | Status: AC
  Filled 2022-12-24: qty 30

## 2022-12-24 MED ORDER — KETOROLAC TROMETHAMINE 15 MG/ML IJ SOLN
INTRAMUSCULAR | Status: DC | PRN
Start: 2022-12-24 — End: 2022-12-24
  Administered 2022-12-24: 15 mg via INTRAVENOUS

## 2022-12-24 MED ORDER — CHLORHEXIDINE GLUCONATE 0.12 % MT SOLN
OROMUCOSAL | Status: AC
  Administered 2022-12-24: 15 mL via OROMUCOSAL
  Filled 2022-12-24: qty 15

## 2022-12-24 MED ORDER — BUPIVACAINE-EPINEPHRINE 0.25% -1:200000 IJ SOLN
INTRAMUSCULAR | Status: DC | PRN
  Administered 2022-12-24: 10 mL

## 2022-12-24 MED ORDER — LIDOCAINE 2% (20 MG/ML) 5 ML SYRINGE
INTRAMUSCULAR | Status: AC
  Filled 2022-12-24: qty 5

## 2022-12-24 MED ORDER — ACETAMINOPHEN 500 MG PO TABS
ORAL_TABLET | ORAL | Status: AC
  Administered 2022-12-24: 1000 mg via ORAL
  Filled 2022-12-24: qty 2

## 2022-12-24 MED ORDER — ACETAMINOPHEN 160 MG/5ML PO SOLN: 325.0000 mg | ORAL | Status: DC | PRN

## 2022-12-24 MED ORDER — CHLORHEXIDINE GLUCONATE CLOTH 2 % EX PADS: 6.0000 | MEDICATED_PAD | Freq: Once | CUTANEOUS | Status: DC

## 2022-12-24 MED ORDER — KETOROLAC TROMETHAMINE 30 MG/ML IJ SOLN: 30.0000 mg | Freq: Once | INTRAMUSCULAR | Status: DC | PRN

## 2022-12-24 MED ORDER — PROPOFOL 500 MG/50ML IV EMUL
INTRAVENOUS | Status: DC | PRN
  Administered 2022-12-24: 150 ug/kg/min via INTRAVENOUS

## 2022-12-24 MED ORDER — ONDANSETRON HCL 4 MG/2ML IJ SOLN
INTRAMUSCULAR | Status: DC | PRN
  Administered 2022-12-24 (×2): 4 mg via INTRAVENOUS

## 2022-12-24 MED ORDER — FENTANYL CITRATE (PF) 250 MCG/5ML IJ SOLN
INTRAMUSCULAR | Status: AC
  Filled 2022-12-24: qty 5

## 2022-12-24 MED ORDER — 0.9 % SODIUM CHLORIDE (POUR BTL) OPTIME
TOPICAL | Status: DC | PRN
  Administered 2022-12-24: 1000 mL

## 2022-12-24 MED ORDER — PROPOFOL 10 MG/ML IV BOLUS
INTRAVENOUS | Status: AC
  Filled 2022-12-24: qty 20

## 2022-12-24 MED ORDER — ACETAMINOPHEN 500 MG PO TABS: 1000.0000 mg | ORAL_TABLET | ORAL | Status: AC

## 2022-12-24 MED ORDER — GLYCOPYRROLATE PF 0.2 MG/ML IJ SOSY
PREFILLED_SYRINGE | INTRAMUSCULAR | Status: AC
  Filled 2022-12-24: qty 1

## 2022-12-24 MED ORDER — PROPOFOL 10 MG/ML IV BOLUS
INTRAVENOUS | Status: DC | PRN
  Administered 2022-12-24: 150 mg via INTRAVENOUS
  Administered 2022-12-24: 50 mg via INTRAVENOUS

## 2022-12-24 MED ORDER — MEPERIDINE HCL 25 MG/ML IJ SOLN: 6.2500 mg | INTRAMUSCULAR | Status: DC | PRN

## 2022-12-24 MED ORDER — ONDANSETRON HCL 4 MG/2ML IJ SOLN: 4.0000 mg | Freq: Once | INTRAMUSCULAR | Status: DC | PRN

## 2022-12-24 MED ORDER — DEXMEDETOMIDINE HCL IN NACL 80 MCG/20ML IV SOLN
INTRAVENOUS | Status: AC
  Filled 2022-12-24: qty 20

## 2022-12-24 MED ORDER — PROPOFOL 1000 MG/100ML IV EMUL
INTRAVENOUS | Status: AC
  Filled 2022-12-24: qty 100

## 2022-12-24 MED ORDER — DEXMEDETOMIDINE HCL IN NACL 80 MCG/20ML IV SOLN
INTRAVENOUS | Status: DC | PRN
  Administered 2022-12-24: 20 ug via INTRAVENOUS

## 2022-12-24 MED ORDER — MIDAZOLAM HCL 2 MG/2ML IJ SOLN
INTRAMUSCULAR | Status: DC | PRN
  Administered 2022-12-24: 2 mg via INTRAVENOUS

## 2022-12-24 MED ORDER — KETOROLAC TROMETHAMINE 30 MG/ML IJ SOLN
INTRAMUSCULAR | Status: AC
  Filled 2022-12-24: qty 1

## 2022-12-24 MED ORDER — ACETAMINOPHEN 325 MG PO TABS: 325.0000 mg | ORAL_TABLET | ORAL | Status: DC | PRN

## 2022-12-24 MED ORDER — CEFAZOLIN SODIUM-DEXTROSE 2-4 GM/100ML-% IV SOLN
INTRAVENOUS | Status: AC
  Filled 2022-12-24: qty 100

## 2022-12-24 MED ORDER — LIDOCAINE 2% (20 MG/ML) 5 ML SYRINGE
INTRAMUSCULAR | Status: DC | PRN
  Administered 2022-12-24: 100 mg via INTRAVENOUS

## 2022-12-24 MED ORDER — CHLORHEXIDINE GLUCONATE 0.12 % MT SOLN: 15.0000 mL | Freq: Once | OROMUCOSAL | Status: AC

## 2022-12-24 MED ORDER — MIDAZOLAM HCL 2 MG/2ML IJ SOLN
INTRAMUSCULAR | Status: AC
  Filled 2022-12-24: qty 2

## 2022-12-24 MED ORDER — OXYCODONE HCL 5 MG PO TABS: 5.0000 mg | ORAL_TABLET | Freq: Once | ORAL | Status: DC | PRN

## 2022-12-24 SURGICAL SUPPLY — 37 items
APL PRP STRL LF DISP 70% ISPRP (MISCELLANEOUS) ×1
APL SKNCLS STERI-STRIP NONHPOA (GAUZE/BANDAGES/DRESSINGS) ×1
BAG COUNTER SPONGE SURGICOUNT (BAG) ×1 IMPLANT
BAG SPNG CNTER NS LX DISP (BAG) ×1
BENZOIN TINCTURE PRP APPL 2/3 (GAUZE/BANDAGES/DRESSINGS) ×1 IMPLANT
CANISTER SUCT 3000ML PPV (MISCELLANEOUS) ×1 IMPLANT
CHLORAPREP W/TINT 26 (MISCELLANEOUS) ×1 IMPLANT
COVER PROBE W GEL 5X96 (DRAPES) ×1 IMPLANT
COVER SURGICAL LIGHT HANDLE (MISCELLANEOUS) ×1 IMPLANT
DEVICE DUBIN SPECIMEN MAMMOGRA (MISCELLANEOUS) ×1 IMPLANT
DRAPE CHEST BREAST 15X10 FENES (DRAPES) ×1 IMPLANT
DRSG TEGADERM 4X4.75 (GAUZE/BANDAGES/DRESSINGS) ×1 IMPLANT
ELECT BLADE 4.0 EZ CLEAN MEGAD (MISCELLANEOUS) ×1
ELECT CAUTERY BLADE 6.4 (BLADE) ×1 IMPLANT
ELECT REM PT RETURN 9FT ADLT (ELECTROSURGICAL) ×1
ELECTRODE BLDE 4.0 EZ CLN MEGD (MISCELLANEOUS) IMPLANT
ELECTRODE REM PT RTRN 9FT ADLT (ELECTROSURGICAL) ×1 IMPLANT
GAUZE SPONGE 2X2 8PLY STRL LF (GAUZE/BANDAGES/DRESSINGS) ×1 IMPLANT
GAUZE SPONGE 2X2 STRL 8-PLY (GAUZE/BANDAGES/DRESSINGS) IMPLANT
GLOVE BIO SURGEON STRL SZ7 (GLOVE) ×1 IMPLANT
GLOVE BIOGEL PI IND STRL 7.5 (GLOVE) ×1 IMPLANT
GOWN STRL REUS W/ TWL LRG LVL3 (GOWN DISPOSABLE) ×2 IMPLANT
GOWN STRL REUS W/TWL LRG LVL3 (GOWN DISPOSABLE) ×2
KIT BASIN OR (CUSTOM PROCEDURE TRAY) ×1 IMPLANT
KIT MARKER MARGIN INK (KITS) ×1 IMPLANT
NDL HYPO 25GX1X1/2 BEV (NEEDLE) ×1 IMPLANT
NEEDLE HYPO 25GX1X1/2 BEV (NEEDLE) ×1
NS IRRIG 1000ML POUR BTL (IV SOLUTION) ×1 IMPLANT
PACK GENERAL/GYN (CUSTOM PROCEDURE TRAY) ×1 IMPLANT
SPONGE T-LAP 4X18 ~~LOC~~+RFID (SPONGE) ×1 IMPLANT
STRIP CLOSURE SKIN 1/2X4 (GAUZE/BANDAGES/DRESSINGS) ×1 IMPLANT
SUT MNCRL AB 4-0 PS2 18 (SUTURE) ×1 IMPLANT
SUT VIC AB 3-0 SH 27 (SUTURE) ×1
SUT VIC AB 3-0 SH 27X BRD (SUTURE) ×1 IMPLANT
SYR CONTROL 10ML LL (SYRINGE) ×1 IMPLANT
TOWEL GREEN STERILE (TOWEL DISPOSABLE) ×1 IMPLANT
TOWEL GREEN STERILE FF (TOWEL DISPOSABLE) ×1 IMPLANT

## 2022-12-24 NOTE — Anesthesia Procedure Notes (Signed)
Procedure Name: LMA Insertion Date/Time: 12/24/2022 9:59 AM  Performed by: Camillia Herter, CRNAPre-anesthesia Checklist: Patient identified, Emergency Drugs available, Suction available and Patient being monitored Patient Re-evaluated:Patient Re-evaluated prior to induction Oxygen Delivery Method: Circle System Utilized Preoxygenation: Pre-oxygenation with 100% oxygen Induction Type: IV induction Ventilation: Mask ventilation without difficulty LMA: LMA inserted LMA Size: 4.0 Number of attempts: 1 Airway Equipment and Method: Bite block Placement Confirmation: positive ETCO2 Tube secured with: Tape Dental Injury: Teeth and Oropharynx as per pre-operative assessment

## 2022-12-24 NOTE — Anesthesia Postprocedure Evaluation (Signed)
Anesthesia Post Note  Patient: Jodi Murray  Procedure(s) Performed: RIGHT BREAST LUMPECTOMY WITH RADIOACTIVE SEED LOCALIZATION x2 (Right: Breast)     Patient location during evaluation: PACU Anesthesia Type: General Level of consciousness: awake and sedated Pain management: pain level controlled Vital Signs Assessment: post-procedure vital signs reviewed and stable Respiratory status: spontaneous breathing Cardiovascular status: stable Postop Assessment: no apparent nausea or vomiting Anesthetic complications: no  No notable events documented.  Last Vitals:  Vitals:   12/24/22 1100 12/24/22 1115  BP: 102/68 104/74  Pulse: 78 71  Resp: 16 18  Temp: (!) 36.4 C   SpO2: 98% 98%    Last Pain:  Vitals:   12/24/22 1100  TempSrc:   PainSc: 0-No pain                 Caren Macadam

## 2022-12-24 NOTE — Interval H&P Note (Signed)
History and Physical Interval Note:  12/24/2022 7:34 AM  Jodi Murray  has presented today for surgery, with the diagnosis of RIGHT BREAST RADIAL SCAR, RIGHT BREAST FIBROADENOMA.  The various methods of treatment have been discussed with the patient and family. After consideration of risks, benefits and other options for treatment, the patient has consented to  Procedure(s) with comments: RIGHT BREAST LUMPECTOMY WITH RADIOACTIVE SEED LOCALIZATION x2 (Right) - LMA as a surgical intervention.  The patient's history has been reviewed, patient examined, no change in status, stable for surgery.  I have reviewed the patient's chart and labs.  Questions were answered to the patient's satisfaction.     [name]

## 2022-12-24 NOTE — Transfer of Care (Signed)
Immediate Anesthesia Transfer of Care Note  Patient: Jodi Murray  Procedure(s) Performed: RIGHT BREAST LUMPECTOMY WITH RADIOACTIVE SEED LOCALIZATION x2 (Right: Breast)  Patient Location: PACU  Anesthesia Type:General  Level of Consciousness: awake, alert , and oriented  Airway & Oxygen Therapy: Patient Spontanous Breathing and Patient connected to face mask  Post-op Assessment: Report given to RN and Post -op Vital signs reviewed and stable  Post vital signs: Reviewed and stable  Last Vitals:  Vitals Value Taken Time  BP 102/68 12/24/22 1100  Temp 36.4 C 12/24/22 1100  Pulse 68 12/24/22 1105  Resp 15 12/24/22 1105  SpO2 97 % 12/24/22 1105  Vitals shown include unfiled device data.  Last Pain:  Vitals:   12/24/22 1100  TempSrc:   PainSc: 0-No pain      Patients Stated Pain Goal: 0 (12/24/22 0756)  Complications: No notable events documented.

## 2022-12-24 NOTE — Op Note (Addendum)
Pre-op Diagnosis:  Right breast fibroadenoma/ right breast radial scar Post-op Diagnosis: same Procedure:  Right breast radioactive seed localized lumpectomy Surgeon:  Nelwyn Hebdon K. Anesthesia:  GEN - LMA Indications:  This is a 37 year old female who presents with a palpable mass in the upper right breast for the last 12 years.  This seems to be enlarging slightly.  There is some mild associated discomfort.  The patient denies any family history of breast cancer.  Recently she underwent mammogram and ultrasound.  The mammogram revealed some distortion in the upper inner right breast with some adjacent oval masses.  Ultrasound showed a mass at 12:00 in the right breast located 4 cm from the nipple measuring 1.1 x 0.5 x 1.2 cm.  Just adjacent to this area there is another mass measuring 0.7 x 0.4 x 0.5 cm.  In the right breast at 1:00, 4 cm from the nipple there is an irregular hypoechoic mass measuring 2.2 x 0.9 x 1.0 cm.  The axilla was negative.     The mass located at 12:00 was biopsied and revealed a fibroadenoma with no evidence of atypia or malignancy.  The mass located at 1:00 revealed changes consistent with radial scar with no sign of atypia or malignancy.  The patient presents now to discuss excision of both of these areas.  No previous breast surgeries.   Two radioactive seeds were placed by Radiology yesterday.  They seem to be adjacent and we will plan to remove both seeds and both biopsy clips through the same incision and a single lumpectomy specimen.   Description of procedure: The patient is brought to the operating room placed in supine position on the operating room table. After an adequate level of general anesthesia was obtained, her right breast was prepped with ChloraPrep and draped in sterile fashion. A timeout was taken to ensure the proper patient and proper procedure. We interrogated the breast with the neoprobe. We made a circumareolar incision around the upper side of the  nipple after infiltrating with 0.25% Marcaine. Dissection was carried down in the breast tissue with cautery. We dissected superiorly towards the area containing the seeds.  We used the neoprobe to guide [place] towards the radioactive seeds. We excised an area of tissue around the radioactive seed 2.5 cm in diameter. The specimen was removed and was oriented with a paint kit. Specimen mammogram showed both radioactive seeds as well as both biopsy clips within the specimen. This was sent for pathologic examination. There is no residual radioactivity within the biopsy cavity. We inspected carefully for hemostasis. The wound was thoroughly irrigated. The wound was closed with a deep layer of 3-0 Vicryl and a subcuticular layer of 4-0 Monocryl. Benzoin Steri-Strips were applied. The patient was then extubated and brought to the recovery room in stable condition. All sponge, instrument, and needle counts are correct.  Wilmon Arms. Corliss Skains, MD, [facility]  General/ Trauma Surgery  12/24/2022 9:41 AM

## 2022-12-24 NOTE — Discharge Instructions (Signed)

## 2022-12-25 ENCOUNTER — Encounter (HOSPITAL_COMMUNITY): Payer: Self-pay | Admitting: Surgery

## 2023-01-01 LAB — SURGICAL PATHOLOGY

## 2024-05-10 ENCOUNTER — Telehealth: Payer: Self-pay

## 2024-05-10 NOTE — Telephone Encounter (Signed)
 Telephoned patient at mobile number using interpreter, Arnetta Cheek. Left a voice message with [facility] (scholarship) scheduling contact information.

## 2024-05-12 ENCOUNTER — Other Ambulatory Visit: Payer: Self-pay | Admitting: Obstetrics & Gynecology

## 2024-05-12 DIAGNOSIS — Z1231 Encounter for screening mammogram for malignant neoplasm of breast: Secondary | ICD-10-CM
